# Patient Record
Sex: Female | Born: 1955 | Race: White | Hispanic: No | Smoking: Never smoker
Health system: Southern US, Community
[De-identification: ages and names within clinical notes are randomized; demographics above are authoritative.]

## PROBLEM LIST (undated history)

## (undated) DIAGNOSIS — E7800 Pure hypercholesterolemia, unspecified: Secondary | ICD-10-CM

## (undated) DIAGNOSIS — M5134 Other intervertebral disc degeneration, thoracic region: Secondary | ICD-10-CM

## (undated) HISTORY — PX: CATARACT EXTRACTION, BILATERAL: SHX1313

## (undated) HISTORY — DX: Pure hypercholesterolemia, unspecified: E78.00

## (undated) HISTORY — PX: DILATION AND CURETTAGE OF UTERUS: SHX78

## (undated) HISTORY — DX: Other intervertebral disc degeneration, thoracic region: M51.34

---

## 2005-02-26 ENCOUNTER — Emergency Department (HOSPITAL_COMMUNITY): Admission: EM | Admit: 2005-02-26 | Discharge: 2005-02-26 | Payer: Self-pay | Admitting: Emergency Medicine

## 2018-03-31 ENCOUNTER — Other Ambulatory Visit: Payer: Self-pay | Admitting: Obstetrics and Gynecology

## 2018-03-31 ENCOUNTER — Other Ambulatory Visit: Payer: Self-pay

## 2018-03-31 ENCOUNTER — Ambulatory Visit (INDEPENDENT_AMBULATORY_CARE_PROVIDER_SITE_OTHER): Payer: No Typology Code available for payment source | Admitting: Obstetrics and Gynecology

## 2018-03-31 ENCOUNTER — Encounter: Payer: Self-pay | Admitting: Obstetrics and Gynecology

## 2018-03-31 ENCOUNTER — Other Ambulatory Visit (HOSPITAL_COMMUNITY)
Admission: RE | Admit: 2018-03-31 | Discharge: 2018-03-31 | Disposition: A | Discharge status: Home / Self Care | Payer: No Typology Code available for payment source | Source: Ambulatory Visit | Attending: Obstetrics and Gynecology | Admitting: Obstetrics and Gynecology

## 2018-03-31 VITALS — BP 126/70 | HR 60 | Resp 16 | Ht 63.5 in | Wt 173.4 lb

## 2018-03-31 DIAGNOSIS — Z01419 Encounter for gynecological examination (general) (routine) without abnormal findings: Secondary | ICD-10-CM

## 2018-03-31 DIAGNOSIS — Z1231 Encounter for screening mammogram for malignant neoplasm of breast: Secondary | ICD-10-CM

## 2018-03-31 NOTE — Progress Notes (Signed)
63 y.o. G11P1010 Widowed Caucasian female here for annual exam.    Husband passed in 2018 and she moved back home to be closer to family.  He had a heart valve and had organ failure following surgery. Son is 64 yo and living at home with her following her divorce.   Using Vagifem.  She has refills for one year.  Not sexually active.   Taking calclium 500 mg two at hs.  Takes daily vit D 2000 IU daily.   Labs with PCP in March.   PCP: Tracey Harries, MD    Patient's last menstrual period was 03/05/2008 (approximate).           Sexually active: No.  The current method of family planning is post menopausal status.    Exercising: Yes.    walks a lot of stairs at home Smoker:  no  Health Maintenance: Pap:  03/2017 normal per patient History of abnormal Pap:  no MMG:  05/2017 normal per patient--in [facility] Colonoscopy:  2015 polyps;next due 2020 (was done in [place]).  States she is due in 10 years.  BMD:   2017  Result  Normal per patient --in [place] TDaP:  Over 10 years--does not want Gardasil:   no ZOX:WRUEA.  Did a recent insurance exam.  Hep C:never.  Did a recent insurance exam. Screening Labs:  PCP.  Flu vaccine:  Done.    reports that she has never smoked. She has never used smokeless tobacco. She reports current alcohol use. She reports that she does not use drugs.  Past Medical History:  Diagnosis Date  . Elevated cholesterol     Past Surgical History:  Procedure Laterality Date  . CATARACT EXTRACTION, BILATERAL    . CESAREAN SECTION  1983   [place]  . DILATION AND CURETTAGE OF UTERUS      Current Outpatient Medications  Medication Sig Dispense Refill  . albuterol (PROVENTIL HFA;VENTOLIN HFA) 108 (90 Base) MCG/ACT inhaler Inhale 1 puff into the lungs as needed.    . B Complex-C (B-COMPLEX WITH VITAMIN C) tablet Take 1 tablet by mouth daily.    . cetirizine (ZYRTEC) 10 MG tablet Take 10 mg by mouth daily.    . Cholecalciferol (VITAMIN D) 50 MCG (2000 UT) CAPS Take 1 tablet  by mouth daily.    . Coenzyme Q10 (COQ-10) 100 MG CAPS Take 1 tablet by mouth daily.    Marland Kitchen Lysine 500 MG TABS Take 1 tablet by mouth daily.    . magnesium gluconate (MAGONATE) 500 MG tablet Take 1 tablet by mouth daily.    . pravastatin (PRAVACHOL) 20 MG tablet Take 20 mg by mouth daily.    . Probiotic Product (ADVANCED PROBIOTIC-14) CAPS Take 1 capsule by mouth daily.    . Red Yeast Rice 600 MG TABS Take 1 tablet by mouth daily.    Anson Fret 10 MCG TABS vaginal tablet Place 1 tablet vaginally 2 (two) times a week.     No current facility-administered medications for this visit.     Family History  Problem Relation Age of Onset  . Hyperlipidemia Mother   . Hyperlipidemia Father   . Breast cancer Maternal Aunt   . Diabetes Maternal Grandfather   . Hypertension Sister     Review of Systems  All other systems reviewed and are negative.   Exam:   BP 126/70 (BP Location: Right Arm, Patient Position: Sitting, Cuff Size: Large)   Pulse 60   Resp 16   Ht 5' 3.5" (  1.613 m)   Wt 173 lb 6.4 oz (78.7 kg)   LMP 03/05/2008 (Approximate)   BMI 30.23 kg/m     General appearance: alert, cooperative and appears stated age Head: Normocephalic, without obvious abnormality, atraumatic Neck: no adenopathy, supple, symmetrical, trachea midline and thyroid normal to inspection and palpation Lungs: clear to auscultation bilaterally Breasts: normal appearance, no masses or tenderness, No nipple retraction or dimpling, No nipple discharge or bleeding, No axillary or supraclavicular adenopathy Heart: regular rate and rhythm Abdomen: soft, non-tender; no masses, no organomegaly Extremities: extremities normal, atraumatic, no cyanosis or edema Skin: Skin color, texture, turgor normal. No rashes or lesions Lymph nodes: Cervical, supraclavicular, and axillary nodes normal. No abnormal inguinal nodes palpated Neurologic: Grossly normal  Pelvic: External genitalia:  no lesions              Urethra:   normal appearing urethra with no masses, tenderness or lesions              Bartholins and Skenes: normal                 Vagina: normal appearing vagina with normal color and discharge, no lesions              Cervix: no lesions              Pap taken: Yes.   Bimanual Exam:  Uterus:  normal size, contour, position, consistency, mobility, non-tender              Adnexa: no mass, fullness, tenderness              Rectal exam: Yes.  .  Confirms.              Anus:  normal sphincter tone, no lesions  Chaperone was present for exam.  Assessment:   Well woman visit with normal exam. Hyperlipidemia.  Vaginal atrophy.  On Vagifem.  Does not need refills at this time.  Plan: Mammogram screening. Facility list given to patient.  She will schedule.  Recommended self breast awareness. Pap and HR HPV as above. Guidelines for Calcium, Vitamin D, regular exercise program including cardiovascular and weight bearing exercise. Labs with PCP.  Take calcium bid.   Follow up annually and prn.    After visit summary provided.

## 2018-03-31 NOTE — Patient Instructions (Signed)

## 2018-04-01 LAB — CYTOLOGY - PAP
Diagnosis: NEGATIVE
HPV: NOT DETECTED

## 2018-04-30 ENCOUNTER — Ambulatory Visit
Admission: RE | Admit: 2018-04-30 | Discharge: 2018-04-30 | Disposition: A | Discharge status: Home / Self Care | Payer: PRIVATE HEALTH INSURANCE | Source: Ambulatory Visit | Attending: Obstetrics and Gynecology | Admitting: Obstetrics and Gynecology

## 2018-04-30 DIAGNOSIS — Z1231 Encounter for screening mammogram for malignant neoplasm of breast: Secondary | ICD-10-CM

## 2019-02-24 ENCOUNTER — Other Ambulatory Visit: Payer: Self-pay | Admitting: Obstetrics and Gynecology

## 2019-02-24 DIAGNOSIS — Z01419 Encounter for gynecological examination (general) (routine) without abnormal findings: Secondary | ICD-10-CM

## 2019-02-24 MED ORDER — YUVAFEM 10 MCG VA TABS: 1.0000 | ORAL_TABLET | VAGINAL | 2 refills | Status: DC

## 2019-02-24 NOTE — Telephone Encounter (Signed)
Spoke to pt. Pt made aware of refill request had been filled. Pt agreeable.   Routing to provider for final review. Patient is agreeable to disposition. Will close encounter.

## 2019-02-24 NOTE — Telephone Encounter (Signed)
Med refill request: Yuvafem Last AEX:03/31/2018 Next AEX: 04/08/2019 Last MMG (if hormonal med) 04/30/2018 BIRADS 1, Negative Refill authorized: #14 tabs, 0 RF to get to AEX, orders pended if approved.

## 2019-02-24 NOTE — Telephone Encounter (Signed)
Patient requesting refill on Yuvafem. [place] [phone].

## 2019-03-17 ENCOUNTER — Other Ambulatory Visit: Payer: Self-pay | Admitting: Obstetrics and Gynecology

## 2019-03-17 DIAGNOSIS — Z1231 Encounter for screening mammogram for malignant neoplasm of breast: Secondary | ICD-10-CM

## 2019-04-07 NOTE — Progress Notes (Signed)
64 y.o. G62P1010 Widowed Caucasian female here for annual exam.    She is vaginal estrogen treatment.   Tested negative for Covid on 04/01/19 due to an indirect exposure.   She has battled an upper respiratory infection this winter.  PCP caring for her.  She is taking over the counter medication.  She did test negative for Covid with this evaluation also.   PCP:  Bernerd Limbo, MD   Patient's last menstrual period was 03/05/2008 (approximate).           Sexually active: No.  The current method of family planning is post menopausal status.    Exercising: Yes.    going up steps and walking Smoker:  no  Health Maintenance: Pap: 03-31-18 Neg:Neg HR HPV,  03/2017 normal per patient History of abnormal Pap:  no MMG: 04-30-18 3D/Neg/density C/BiRads1.  Already scheduled for March, 2021.  Colonoscopy: 2018 polyps;(was done in [place]).  States she is due in 2023.  BMD:   2017  Result :normal per patient in [place]: PCP Gardasil:   no HIV: a few yrs ago for Ins. Hep C: a few yrs ago for Ins. Screening Labs:   PCP. Flu vaccine:  Completed.    reports that she has never smoked. She has never used smokeless tobacco. She reports previous alcohol use. She reports that she does not use drugs.  Past Medical History:  Diagnosis Date  . Elevated cholesterol     Past Surgical History:  Procedure Laterality Date  . CATARACT EXTRACTION, BILATERAL    . [place]  . DILATION AND CURETTAGE OF UTERUS      Current Outpatient Medications  Medication Sig Dispense Refill  . albuterol (PROVENTIL HFA;VENTOLIN HFA) 108 (90 Base) MCG/ACT inhaler Inhale 1 puff into the lungs as needed.    . B Complex-C (B-COMPLEX WITH VITAMIN C) tablet Take 1 tablet by mouth daily.    . calcium-vitamin D (OSCAL WITH D) 500-200 MG-UNIT TABS tablet Take 1 tablet by mouth daily.    . Cholecalciferol (VITAMIN D) 50 MCG (2000 UT) CAPS Take 1 tablet by mouth daily.    . Coenzyme Q10 (COQ-10) 100 MG CAPS Take  1 tablet by mouth daily.    . fexofenadine (ALLEGRA) 180 MG tablet Take 180 mg by mouth as needed for allergies or rhinitis.    Marland Kitchen Lysine 500 MG TABS Take 1 tablet by mouth daily.    . magnesium gluconate (MAGONATE) 500 MG tablet Take 1 tablet by mouth daily.    . pravastatin (PRAVACHOL) 20 MG tablet Take 20 mg by mouth daily.    . Probiotic Product (ADVANCED PROBIOTIC-14) CAPS Take 1 capsule by mouth daily.    . Red Yeast Rice 600 MG TABS Take 1 tablet by mouth daily.    Merril Abbe 10 MCG TABS vaginal tablet Place 1 tablet (10 mcg total) vaginally 2 (two) times a week. 8 tablet 2   No current facility-administered medications for this visit.    Family History  Problem Relation Age of Onset  . Hyperlipidemia Mother   . Hyperlipidemia Father   . Breast cancer Maternal Aunt   . Diabetes Maternal Grandfather   . Hypertension Sister     Review of Systems  All other systems reviewed and are negative.   Exam:   BP 124/82 (Cuff Size: Large)   Pulse 64   Temp (!) 97.3 F (36.3 C) (Temporal)   Resp 18   Ht 5' 3.25" (1.607 m)  Wt 177 lb (80.3 kg)   LMP 03/05/2008 (Approximate)   BMI 31.11 kg/m     General appearance: alert, cooperative and appears stated age Head: normocephalic, without obvious abnormality, atraumatic Neck: no adenopathy, supple, symmetrical, trachea midline and thyroid normal to inspection and palpation Lungs: clear to auscultation bilaterally Breasts: normal appearance, no masses or tenderness, No nipple retraction or dimpling, No nipple discharge or bleeding, No axillary adenopathy Heart: regular rate and rhythm Abdomen: soft, non-tender; no masses, no organomegaly Extremities: extremities normal, atraumatic, no cyanosis or edema Skin: skin color, texture, turgor normal. No rashes or lesions Lymph nodes: cervical, supraclavicular, and axillary nodes normal. Neurologic: grossly normal  Pelvic: External genitalia:  no lesions              No abnormal inguinal  nodes palpated.              Urethra:  normal appearing urethra with no masses, tenderness or lesions              Bartholins and Skenes: normal                 Vagina: normal appearing vagina with normal color and discharge, no lesions              Cervix: no lesions              Pap taken: No. Bimanual Exam:  Uterus:  normal size, contour, position, consistency, mobility, non-tender              Adnexa: no mass, fullness, tenderness              Rectal exam: Yes.  .  Confirms.              Anus:  normal sphincter tone, no lesions  Chaperone was present for exam.  Assessment:   Well woman visit with normal exam. Vaginal atrophy.  On Vagifem.   Plan: Mammogram screening discussed. Self breast awareness reviewed. Pap and HR HPV as above. Guidelines for Calcium, Vitamin D, regular exercise program including cardiovascular and weight bearing exercise. Refill of Vagifem.   I discussed potential effect on breast cancer.  Labs with PCP. Follow up annually and prn.   After visit summary provided.

## 2019-04-08 ENCOUNTER — Encounter: Payer: Self-pay | Admitting: Obstetrics and Gynecology

## 2019-04-08 ENCOUNTER — Ambulatory Visit (INDEPENDENT_AMBULATORY_CARE_PROVIDER_SITE_OTHER): Payer: No Typology Code available for payment source | Admitting: Obstetrics and Gynecology

## 2019-04-08 ENCOUNTER — Other Ambulatory Visit: Payer: Self-pay

## 2019-04-08 DIAGNOSIS — Z01419 Encounter for gynecological examination (general) (routine) without abnormal findings: Secondary | ICD-10-CM

## 2019-04-08 MED ORDER — YUVAFEM 10 MCG VA TABS: 1.0000 | ORAL_TABLET | VAGINAL | 3 refills | Status: DC

## 2019-04-08 NOTE — Patient Instructions (Signed)

## 2019-05-04 ENCOUNTER — Other Ambulatory Visit: Payer: Self-pay

## 2019-05-04 ENCOUNTER — Ambulatory Visit
Admission: RE | Admit: 2019-05-04 | Discharge: 2019-05-04 | Disposition: A | Discharge status: Home / Self Care | Payer: PRIVATE HEALTH INSURANCE | Source: Ambulatory Visit | Attending: Obstetrics and Gynecology | Admitting: Obstetrics and Gynecology

## 2019-05-04 DIAGNOSIS — Z1231 Encounter for screening mammogram for malignant neoplasm of breast: Secondary | ICD-10-CM

## 2019-05-06 ENCOUNTER — Other Ambulatory Visit: Payer: Self-pay | Admitting: Obstetrics and Gynecology

## 2019-05-06 DIAGNOSIS — R928 Other abnormal and inconclusive findings on diagnostic imaging of breast: Secondary | ICD-10-CM

## 2019-05-20 ENCOUNTER — Ambulatory Visit
Admission: RE | Admit: 2019-05-20 | Discharge: 2019-05-20 | Disposition: A | Discharge status: Home / Self Care | Payer: PRIVATE HEALTH INSURANCE | Source: Ambulatory Visit | Attending: Obstetrics and Gynecology | Admitting: Obstetrics and Gynecology

## 2019-05-20 ENCOUNTER — Other Ambulatory Visit: Payer: Self-pay

## 2019-05-20 ENCOUNTER — Ambulatory Visit: Payer: PRIVATE HEALTH INSURANCE

## 2019-05-20 DIAGNOSIS — R928 Other abnormal and inconclusive findings on diagnostic imaging of breast: Secondary | ICD-10-CM

## 2019-07-02 ENCOUNTER — Other Ambulatory Visit: Payer: Self-pay

## 2019-07-02 ENCOUNTER — Emergency Department (HOSPITAL_BASED_OUTPATIENT_CLINIC_OR_DEPARTMENT_OTHER): Payer: PRIVATE HEALTH INSURANCE

## 2019-07-02 ENCOUNTER — Observation Stay (HOSPITAL_BASED_OUTPATIENT_CLINIC_OR_DEPARTMENT_OTHER)
Admission: EM | Admit: 2019-07-02 | Discharge: 2019-07-04 | Disposition: A | Discharge status: Home / Self Care | Payer: PRIVATE HEALTH INSURANCE | Attending: Physician Assistant | Admitting: Physician Assistant

## 2019-07-02 ENCOUNTER — Encounter (HOSPITAL_BASED_OUTPATIENT_CLINIC_OR_DEPARTMENT_OTHER): Payer: Self-pay | Admitting: *Deleted

## 2019-07-02 DIAGNOSIS — Z79899 Other long term (current) drug therapy: Secondary | ICD-10-CM | POA: Insufficient documentation

## 2019-07-02 DIAGNOSIS — E7800 Pure hypercholesterolemia, unspecified: Secondary | ICD-10-CM | POA: Diagnosis not present

## 2019-07-02 DIAGNOSIS — Z20822 Contact with and (suspected) exposure to covid-19: Secondary | ICD-10-CM | POA: Insufficient documentation

## 2019-07-02 DIAGNOSIS — S2241XA Multiple fractures of ribs, right side, initial encounter for closed fracture: Principal | ICD-10-CM | POA: Insufficient documentation

## 2019-07-02 DIAGNOSIS — S2239XA Fracture of one rib, unspecified side, initial encounter for closed fracture: Secondary | ICD-10-CM | POA: Diagnosis present

## 2019-07-02 DIAGNOSIS — I700 Atherosclerosis of aorta: Secondary | ICD-10-CM | POA: Insufficient documentation

## 2019-07-02 DIAGNOSIS — Z881 Allergy status to other antibiotic agents status: Secondary | ICD-10-CM | POA: Diagnosis not present

## 2019-07-02 DIAGNOSIS — S2249XA Multiple fractures of ribs, unspecified side, initial encounter for closed fracture: Secondary | ICD-10-CM | POA: Diagnosis present

## 2019-07-02 DIAGNOSIS — W11XXXA Fall on and from ladder, initial encounter: Secondary | ICD-10-CM | POA: Diagnosis not present

## 2019-07-02 DIAGNOSIS — I2510 Atherosclerotic heart disease of native coronary artery without angina pectoris: Secondary | ICD-10-CM | POA: Diagnosis not present

## 2019-07-02 DIAGNOSIS — J9811 Atelectasis: Secondary | ICD-10-CM | POA: Diagnosis not present

## 2019-07-02 DIAGNOSIS — S270XXA Traumatic pneumothorax, initial encounter: Secondary | ICD-10-CM | POA: Diagnosis not present

## 2019-07-02 DIAGNOSIS — J939 Pneumothorax, unspecified: Secondary | ICD-10-CM | POA: Diagnosis present

## 2019-07-02 LAB — COMPREHENSIVE METABOLIC PANEL
ALT: 19 U/L (ref 0–44)
AST: 26 U/L (ref 15–41)
Albumin: 4.4 g/dL (ref 3.5–5.0)
Alkaline Phosphatase: 65 U/L (ref 38–126)
Anion gap: 11 (ref 5–15)
BUN: 16 mg/dL (ref 8–23)
CO2: 25 mmol/L (ref 22–32)
Calcium: 9.6 mg/dL (ref 8.9–10.3)
Chloride: 104 mmol/L (ref 98–111)
Creatinine, Ser: 0.92 mg/dL (ref 0.44–1.00)
GFR calc Af Amer: >60 mL/min (ref >60)
GFR calc non Af Amer: >60 mL/min (ref >60)
Glucose, Bld: 109 mg/dL — ABNORMAL HIGH (ref 70–99)
Potassium: 4.4 mmol/L (ref 3.5–5.1)
Sodium: 140 mmol/L (ref 135–145)
Total Bilirubin: 0.4 mg/dL (ref 0.3–1.2)
Total Protein: 7.5 g/dL (ref 6.5–8.1)

## 2019-07-02 LAB — CBC
HCT: 38.5 % (ref 36.0–46.0)
HCT: 35.5 % — ABNORMAL LOW (ref 36.0–46.0)
Hemoglobin: 12.4 g/dL (ref 12.0–15.0)
Hemoglobin: 11.1 g/dL — ABNORMAL LOW (ref 12.0–15.0)
MCH: 30.3 pg (ref 26.0–34.0)
MCH: 29.7 pg (ref 26.0–34.0)
MCHC: 32.2 g/dL (ref 30.0–36.0)
MCHC: 31.3 g/dL (ref 30.0–36.0)
MCV: 94.1 fL (ref 80.0–100.0)
MCV: 94.9 fL (ref 80.0–100.0)
Platelets: 265 10*3/uL (ref 150–400)
Platelets: 240 10*3/uL (ref 150–400)
RBC: 4.09 MIL/uL (ref 3.87–5.11)
RBC: 3.74 MIL/uL — ABNORMAL LOW (ref 3.87–5.11)
RDW: 13.7 % (ref 11.5–15.5)
RDW: 13.8 % (ref 11.5–15.5)
WBC: 14.5 10*3/uL — ABNORMAL HIGH (ref 4.0–10.5)
WBC: 10.8 10*3/uL — ABNORMAL HIGH (ref 4.0–10.5)
nRBC: 0 % (ref 0.0–0.2)
nRBC: 0 % (ref 0.0–0.2)

## 2019-07-02 LAB — CREATININE, SERUM
Creatinine, Ser: 0.9 mg/dL (ref 0.44–1.00)
GFR calc Af Amer: >60 mL/min (ref >60)
GFR calc non Af Amer: >60 mL/min (ref >60)

## 2019-07-02 LAB — SARS CORONAVIRUS 2 AG (30 MIN TAT): SARS Coronavirus 2 Ag: NEGATIVE

## 2019-07-02 LAB — PROTIME-INR
INR: 1 (ref 0.8–1.2)
Prothrombin Time: 12.8 s (ref 11.4–15.2)

## 2019-07-02 LAB — LACTIC ACID, PLASMA: Lactic Acid, Venous: 1.2 mmol/L (ref 0.5–1.9)

## 2019-07-02 LAB — ETHANOL: Alcohol, Ethyl (B): <10 mg/dL

## 2019-07-02 MED ORDER — OXYCODONE HCL 5 MG PO TABS
5.0000 mg | ORAL_TABLET | ORAL | Status: DC | PRN
  Administered 2019-07-03 – 2019-07-04 (×4): 5 mg via ORAL
  Filled 2019-07-02 (×4): qty 1

## 2019-07-02 MED ORDER — ONDANSETRON HCL 4 MG/2ML IJ SOLN
4.0000 mg | Freq: Once | INTRAMUSCULAR | Status: AC
  Administered 2019-07-02: 4 mg via INTRAVENOUS

## 2019-07-02 MED ORDER — HYDROMORPHONE HCL 1 MG/ML IJ SOLN
1.0000 mg | INTRAMUSCULAR | Status: DC | PRN
  Administered 2019-07-02: 1 mg via INTRAVENOUS
  Filled 2019-07-02: qty 1

## 2019-07-02 MED ORDER — SODIUM CHLORIDE 0.9 % IV SOLN: 250.0000 mL | INTRAVENOUS | Status: DC | PRN

## 2019-07-02 MED ORDER — ACETAMINOPHEN 500 MG PO TABS
1000.0000 mg | ORAL_TABLET | Freq: Four times a day (QID) | ORAL | Status: AC
  Administered 2019-07-02 – 2019-07-03 (×3): 1000 mg via ORAL
  Filled 2019-07-02 (×3): qty 2

## 2019-07-02 MED ORDER — SODIUM CHLORIDE 0.9% FLUSH: 3.0000 mL | INTRAVENOUS | Status: DC | PRN

## 2019-07-02 MED ORDER — ENOXAPARIN SODIUM 30 MG/0.3ML ~~LOC~~ SOLN
30.0000 mg | Freq: Two times a day (BID) | SUBCUTANEOUS | Status: DC
  Administered 2019-07-03 – 2019-07-04 (×3): 30 mg via SUBCUTANEOUS
  Filled 2019-07-02 (×3): qty 0.3

## 2019-07-02 MED ORDER — MORPHINE SULFATE (PF) 4 MG/ML IV SOLN: 4.0000 mg | INTRAVENOUS | Status: DC | PRN

## 2019-07-02 MED ORDER — OXYCODONE HCL 5 MG PO TABS: 10.0000 mg | ORAL_TABLET | ORAL | Status: DC | PRN

## 2019-07-02 MED ORDER — ONDANSETRON HCL 4 MG/2ML IJ SOLN: 4.0000 mg | Freq: Four times a day (QID) | INTRAMUSCULAR | Status: DC | PRN

## 2019-07-02 MED ORDER — ONDANSETRON HCL 4 MG/2ML IJ SOLN
INTRAMUSCULAR | Status: AC
  Filled 2019-07-02: qty 2

## 2019-07-02 MED ORDER — METHOCARBAMOL 500 MG PO TABS
500.0000 mg | ORAL_TABLET | Freq: Three times a day (TID) | ORAL | Status: DC
  Administered 2019-07-02 – 2019-07-03 (×3): 500 mg via ORAL
  Filled 2019-07-02 (×3): qty 1

## 2019-07-02 MED ORDER — SODIUM CHLORIDE 0.9% FLUSH
3.0000 mL | Freq: Two times a day (BID) | INTRAVENOUS | Status: DC
  Administered 2019-07-02 – 2019-07-04 (×4): 3 mL via INTRAVENOUS

## 2019-07-02 MED ORDER — OXYCODONE-ACETAMINOPHEN 5-325 MG PO TABS
1.0000 | ORAL_TABLET | Freq: Once | ORAL | Status: AC
  Administered 2019-07-02: 1 via ORAL
  Filled 2019-07-02: qty 1

## 2019-07-02 MED ORDER — ONDANSETRON 4 MG PO TBDP: 4.0000 mg | ORAL_TABLET | Freq: Four times a day (QID) | ORAL | Status: DC | PRN

## 2019-07-02 MED ORDER — HYDROMORPHONE HCL 1 MG/ML IJ SOLN
1.0000 mg | Freq: Once | INTRAMUSCULAR | Status: AC
  Administered 2019-07-02: 1 mg via INTRAVENOUS
  Filled 2019-07-02: qty 1

## 2019-07-02 NOTE — ED Notes (Signed)
Attempted to call report to floor, sec took name/# for RN to call back

## 2019-07-02 NOTE — ED Triage Notes (Signed)
She fell off a step stool and hit her right ribs on her wooden bed rail. She feels like she has a muscular injury.

## 2019-07-02 NOTE — ED Provider Notes (Signed)
[facility] EMERGENCY DEPARTMENT Provider Note   CSN: [id number] Arrival date & time: 07/02/19  1319     History Chief Complaint  Patient presents with  . Fall    Angel Poole is a 64 y.o. female with no significant past medical history presenting to ED with mechanical fall.  The patient reports she lost her footing stepping off of a step stool today and fell and struck her right ribs on the wooden rail of her bed.  She had immediate pain at that point.  She comes in with pain in the lateral side of her right ribs.  The pain is pleuritic and worse with inspiration.  She does not feel short of breath or lightheaded.  She denies blood thinner use.  There was no head trauma or LOC  HPI     Past Medical History:  Diagnosis Date  . Elevated cholesterol     There are no problems to display for this patient.   Past Surgical History:  Procedure Laterality Date  . CATARACT EXTRACTION, BILATERAL    . [place]  . DILATION AND CURETTAGE OF UTERUS       OB History    Gravida  2   Para  1   Term  1   Preterm      AB  1   Living        SAB      TAB      Ectopic      Multiple      Live Births              Family History  Problem Relation Age of Onset  . Hyperlipidemia Mother   . Hyperlipidemia Father   . Breast cancer Maternal Aunt   . Diabetes Maternal Grandfather   . Hypertension Sister     Social History   Tobacco Use  . Smoking status: Never Smoker  . Smokeless tobacco: Never Used  Substance Use Topics  . Alcohol use: Not Currently  . Drug use: Never    Home Medications Prior to Admission medications   Medication Sig Start Date End Date Taking? Authorizing Provider  albuterol (PROVENTIL HFA;VENTOLIN HFA) 108 (90 Base) MCG/ACT inhaler Inhale 1 puff into the lungs as needed. 12/06/17   [provider]  B Complex-C (B-COMPLEX WITH VITAMIN C) tablet Take 1 tablet by mouth daily.    [provider]  calcium-vitamin D (OSCAL WITH D) 500-200 MG-UNIT TABS tablet Take 1 tablet by mouth daily.    [provider]  Cholecalciferol (VITAMIN D) 50 MCG (2000 UT) CAPS Take 1 tablet by mouth daily.    [provider]  Coenzyme Q10 (COQ-10) 100 MG CAPS Take 1 tablet by mouth daily.    [provider]  fexofenadine (ALLEGRA) 180 MG tablet Take 180 mg by mouth as needed for allergies or rhinitis.    [provider]  Lysine 500 MG TABS Take 1 tablet by mouth daily.    [provider]  magnesium gluconate (MAGONATE) 500 MG tablet Take 1 tablet by mouth daily.    [provider]  pravastatin (PRAVACHOL) 20 MG tablet Take 20 mg by mouth daily. 02/22/18   [provider]  Probiotic Product (ADVANCED PROBIOTIC-14) CAPS Take 1 capsule by mouth daily.    [provider]  Red Yeast Rice 600 MG TABS Take 1 tablet by mouth daily.    [provider]  Merril Abbe 10  MCG TABS vaginal tablet Place 1 tablet (10 mcg total) vaginally 2 (two) times a week. 04/09/19   Patton Salles, MD    Allergies    Tetracyclines & related  Review of Systems   Review of Systems  Constitutional: Negative for chills and fever.  Eyes: Negative for pain and visual disturbance.  Respiratory: Negative for cough and shortness of breath.   Cardiovascular: Positive for chest pain. Negative for palpitations.  Gastrointestinal: Negative for abdominal pain and vomiting.  Musculoskeletal: Positive for arthralgias and myalgias.  Skin: Positive for wound. Negative for rash.  Neurological: Negative for syncope and headaches.  Psychiatric/Behavioral: Negative for agitation and confusion.  All other systems reviewed and are negative.   Physical Exam Updated Vital Signs BP 110/61 (BP Location: Left Arm)   Pulse 68   Temp 98.3 F (36.8 C) (Oral)   Resp 20   Ht 5\' 4"  (1.626 m)   Wt 80.3 kg   LMP 03/05/2008 (Approximate)   SpO2 97%   BMI  30.39 kg/m   Physical Exam Vitals and nursing note reviewed.  Constitutional:      General: She is not in acute distress.    Appearance: She is well-developed.  HENT:     Head: Normocephalic and atraumatic.  Eyes:     Conjunctiva/sclera: Conjunctivae normal.     Pupils: Pupils are equal, round, and reactive to light.  Cardiovascular:     Rate and Rhythm: Normal rate and regular rhythm.     Pulses: Normal pulses.  Pulmonary:     Effort: Pulmonary effort is normal. No respiratory distress.     Breath sounds: Normal breath sounds.  Abdominal:     General: There is no distension.     Palpations: Abdomen is soft.     Tenderness: There is no abdominal tenderness.  Musculoskeletal:     Cervical back: Neck supple.     Comments: Tenderness to right lateral ribline approx ribs 6-8th Linear superficial abrasion of the skin  Skin:    General: Skin is warm and dry.  Neurological:     General: No focal deficit present.     Mental Status: She is alert and oriented to person, place, and time.  Psychiatric:        Mood and Affect: Mood normal.        Behavior: Behavior normal.     ED Results / Procedures / Treatments   Labs (all labs ordered are listed, but only abnormal results are displayed) Labs Reviewed  COMPREHENSIVE METABOLIC PANEL - Abnormal; Notable for the following components:      Result Value   Glucose, Bld 109 (*)    All other components within normal limits  CBC - Abnormal; Notable for the following components:   WBC 14.5 (*)    All other components within normal limits  LACTIC ACID, PLASMA  PROTIME-INR  ETHANOL  URINALYSIS, ROUTINE W REFLEX MICROSCOPIC    EKG None  Radiology DG Ribs Unilateral W/Chest Right  Result Date: 07/02/2019 CLINICAL DATA:  Right-sided rib pain after fall EXAM: RIGHT RIBS AND CHEST - 3+ VIEW COMPARISON:  None. FINDINGS: Acute minimally displaced fractures of the posterolateral aspect of the right seventh, eighth, and ninth ribs.  Possible small right apical pneumothorax. Heart size is normal. No shift of the mediastinal structures to suggest tension component. Left lung is clear. IMPRESSION: 1. Acute minimally displaced fractures of the posterolateral aspect of the right seventh, eighth, and ninth ribs. 2. Possible small right apical  pneumothorax. These results were called by telephone at the time of interpretation on 07/02/2019 at 3:21 pm to provider Vada Yellen , who verbally acknowledged these results. Electronically Signed   By: Duanne Guess D.O.   On: 07/02/2019 15:21   CT Chest Wo Contrast  Result Date: 07/02/2019 CLINICAL DATA:  Fall off step stool striking right ribs on wooden bed rail. Rib fracture and possible pneumothorax on radiograph. EXAM: CT CHEST WITHOUT CONTRAST TECHNIQUE: Multidetector CT imaging of the chest was performed following the standard protocol without IV contrast. COMPARISON:  Rib radiographs earlier this day. FINDINGS: Cardiovascular: Mild aortic atherosclerosis. No periaortic stranding to suggest injury. Heart is normal in size. There are coronary artery calcifications. No pericardial effusion. Mediastinum/Nodes: No confluent mediastinal contusion. No pneumomediastinum. Decompressed esophagus. No mediastinal adenopathy. No suspicious thyroid nodule. Lungs/Pleura: Small right pneumothorax present anterior inferiorly is well as superior medially. This is less than 10%. Patchy opacities in the right lower lobe likely combination of atelectasis and contusion. There is dependent atelectasis in the lingula and left lower lobe. No left pneumothorax. No significant pleural effusion. Trachea and mainstem bronchi are patent. Upper Abdomen: No free fluid. No evidence of acute injury allowing for lack of IV contrast. Musculoskeletal: Right lateral sixth through ninth rib fractures. The 6, seventh, and eighth rib fractures are minimally displaced. No additional or segmental rib fracture. The left ribs, sternum,  included clavicles and shoulder girdles are intact. Thoracic spine is intact without acute fracture. Mild subcutaneous soft tissue edema at the rib fracture site. IMPRESSION: 1. Right lateral sixth through ninth rib fractures. The sixth, seventh, and eighth rib fractures are minimally displaced. 2. Small right pneumothorax, most prominent anterior inferiorly. Patchy opacities in the right lower lobe likely combination of atelectasis and contusion. 3. Coronary artery calcifications. Aortic Atherosclerosis (ICD10-I70.0). Electronically Signed   By: Narda Rutherford M.D.   On: 07/02/2019 16:14    Procedures Procedures (including critical care time)  Medications Ordered in ED Medications  oxyCODONE-acetaminophen (PERCOCET/ROXICET) 5-325 MG per tablet 1 tablet (1 tablet Oral Given 07/02/19 1513)  HYDROmorphone (DILAUDID) injection 1 mg (1 mg Intravenous Given 07/02/19 1704)  ondansetron (ZOFRAN) injection 4 mg (4 mg Intravenous Given 07/02/19 1709)    ED Course  I have reviewed the triage vital signs and the nursing notes.  Pertinent labs & imaging results that were available during my care of the patient were reviewed by me and considered in my medical decision making (see chart for details).  64 year old female presented to ED with a mechanical fall off a stepstool from no significant height, striking her right ribs on a wooden bed frame.  There was no head trauma or loss of consciousness.  She does have tenderness palpation along her rib line today.  She is not hypoxic and has equal breath sounds bilaterally.  Rib x-rays did demonstrate nondisplaced fractures of the seventh eighth and ninth rib on the right side.  There is a questionable pneumothorax seen at the right apex.  We'll get a CT scan to better characterize this possible PTX.  I spoke with the trauma PA at [facility] who also spoke to Dr Janee Morn the trauma MD.  They requested to be contacted again when the CT results were back.     We gave  percocet for pain control here The patient would have a preference for management at home if possible.  She lives with her son.   On reassessment, patient reported some improvement of her pain with percocet but it  continues to be significant.  She and her family are quite concerned about the rib fractures and the PTX.  Her care was signed out to Dr Dalene Seltzer the evening EDP, who is pending a call back to Dr Janee Morn the trauma surgeon.  Anticipate observation admission for the PTX and pain control, likely just 24 hours.  Patient and family are agreeable to this and understand she would need transfer to [facility] hospital for this.  Final Clinical Impression(s) / ED Diagnoses Final diagnoses:  Traumatic pneumothorax, initial encounter  Closed fracture of multiple ribs of right side, initial encounter    Rx / DC Orders ED Discharge Orders    None       Camara Renstrom, Kermit Balo, MD 07/02/19 1733

## 2019-07-02 NOTE — H&P (Signed)
Angel Poole is an 64 y.o. female.   Chief Complaint: R rib pain HPI: 64 year old female was up on a stepladder changing the filter in her air purifier.  As she was stepping down, she lost her balance and fell, striking her right side on the wooden bed frame.  No loss of consciousness.  She was evaluated at [facility].  She was found to have right rib fractures 6-9 and a small pneumothorax.  She was accepted in transfer for observation and pain control.  Past Medical History:  Diagnosis Date  . Elevated cholesterol     Past Surgical History:  Procedure Laterality Date  . CATARACT EXTRACTION, BILATERAL    . [place]  . DILATION AND CURETTAGE OF UTERUS      Family History  Problem Relation Age of Onset  . Hyperlipidemia Mother   . Hyperlipidemia Father   . Breast cancer Maternal Aunt   . Diabetes Maternal Grandfather   . Hypertension Sister    Social History:  reports that she has never smoked. She has never used smokeless tobacco. She reports previous alcohol use. She reports that she does not use drugs.  Allergies:  Allergies  Allergen Reactions  . Tetracyclines & Related Rash    Medications Prior to Admission  Medication Sig Dispense Refill  . albuterol (PROVENTIL HFA;VENTOLIN HFA) 108 (90 Base) MCG/ACT inhaler Inhale 1 puff into the lungs as needed.    . B Complex-C (B-COMPLEX WITH VITAMIN C) tablet Take 1 tablet by mouth daily.    . calcium-vitamin D (OSCAL WITH D) 500-200 MG-UNIT TABS tablet Take 1 tablet by mouth daily.    . Cholecalciferol (VITAMIN D) 50 MCG (2000 UT) CAPS Take 1 tablet by mouth daily.    . Coenzyme Q10 (COQ-10) 100 MG CAPS Take 1 tablet by mouth daily.    . fexofenadine (ALLEGRA) 180 MG tablet Take 180 mg by mouth as needed for allergies or rhinitis.    Marland Kitchen Lysine 500 MG TABS Take 1 tablet by mouth daily.    . magnesium gluconate (MAGONATE) 500 MG tablet Take 1 tablet by mouth daily.    . pravastatin (PRAVACHOL)  20 MG tablet Take 20 mg by mouth daily.    . Probiotic Product (ADVANCED PROBIOTIC-14) CAPS Take 1 capsule by mouth daily.    . Red Yeast Rice 600 MG TABS Take 1 tablet by mouth daily.    Merril Abbe 10 MCG TABS vaginal tablet Place 1 tablet (10 mcg total) vaginally 2 (two) times a week. 24 tablet 3    Results for orders placed or performed during the hospital encounter of 07/02/19 (from the past 48 hour(s))  Comprehensive metabolic panel     Status: Abnormal   Collection Time: 07/02/19  5:01 PM  Result Value Ref Range   Sodium 140 135 - 145 mmol/L   Potassium 4.4 3.5 - 5.1 mmol/L   Chloride 104 98 - 111 mmol/L   CO2 25 22 - 32 mmol/L   Glucose, Bld 109 (H) 70 - 99 mg/dL    Comment: Glucose reference range applies only to samples taken after fasting for at least 8 hours.   BUN 16 8 - 23 mg/dL   Creatinine, Ser 0.92 0.44 - 1.00 mg/dL   Calcium 9.6 8.9 - 10.3 mg/dL   Total Protein 7.5 6.5 - 8.1 g/dL   Albumin 4.4 3.5 - 5.0 g/dL   AST 26 15 - 41 U/L   ALT 19  0 - 44 U/L   Alkaline Phosphatase 65 38 - 126 U/L   Total Bilirubin 0.4 0.3 - 1.2 mg/dL   GFR calc non Af Amer >60 >60 mL/min   GFR calc Af Amer >60 >60 mL/min   Anion gap 11 5 - 15    Comment: Performed at [facility], [address] [place], [place] [address]  CBC     Status: Abnormal   Collection Time: 07/02/19  5:01 PM  Result Value Ref Range   WBC 14.5 (H) 4.0 - 10.5 K/uL   RBC 4.09 3.87 - 5.11 MIL/uL   Hemoglobin 12.4 12.0 - 15.0 g/dL   HCT 38.5 36.0 - 46.0 %   MCV 94.1 80.0 - 100.0 fL   MCH 30.3 26.0 - 34.0 pg   MCHC 32.2 30.0 - 36.0 g/dL   RDW 13.7 11.5 - 15.5 %   Platelets 265 150 - 400 K/uL   nRBC 0.0 0.0 - 0.2 %    Comment: Performed at [facility], [address] [place], [place] [address]  Ethanol     Status: None   Collection Time: 07/02/19  5:01 PM  Result Value Ref Range   Alcohol, Ethyl (B) <10 <10 mg/dL    Comment:        LOWEST DETECTABLE LIMIT FOR SERUM ALCOHOL IS 10  mg/dL FOR MEDICAL PURPOSES ONLY Performed at [facility], [address] [place], [place] [address]   Lactic acid, plasma     Status: None   Collection Time: 07/02/19  5:01 PM  Result Value Ref Range   Lactic Acid, Venous 1.2 0.5 - 1.9 mmol/L    Comment: Performed at [facility], [address] [place], [place] [address]  Protime-INR     Status: None   Collection Time: 07/02/19  5:01 PM  Result Value Ref Range   Prothrombin Time 12.8 11.4 - 15.2 seconds   INR 1.0 0.8 - 1.2    Comment: (NOTE) INR goal varies based on device and disease states. Performed at [facility], [address] [place], [place] [address]   SARS Coronavirus 2 Ag (30 min TAT) - Nasal Swab (BD Veritor Kit)     Status: None   Collection Time: 07/02/19  7:50 PM   Specimen: Nasal Swab (BD Veritor Kit)  Result Value Ref Range   SARS Coronavirus 2 Ag NEGATIVE NEGATIVE    Comment: (NOTE) SARS-CoV-2 antigen NOT DETECTED.  Negative results are presumptive.  Negative results do not preclude SARS-CoV-2 infection and should not be used as the sole basis for treatment or other patient management decisions, including infection  control decisions, particularly in the presence of clinical signs and  symptoms consistent with COVID-19, or in those who have been in contact with the virus.  Negative results must be combined with clinical observations, patient history, and epidemiological information. The expected result is Negative. Fact Sheet for Patients: PodPark.tn Fact Sheet for Healthcare Providers: GiftContent.is This test is not yet approved or cleared by the [place] FDA and  has been authorized for detection and/or diagnosis of SARS-CoV-2 by FDA under an Emergency Use Authorization (EUA).  This EUA will remain in effect (meaning this test can be used) for the duration of  the COVID-19 de claration under Section  564(b)(1) of the Act, 21 U.S.C. section 360bbb-3(b)(1), unless the authorization is terminated or revoked sooner. Performed at [facility], [address] [place], [place] [address]    DG Ribs Unilateral W/Chest Right  Result Date: 07/02/2019 CLINICAL DATA:  Right-sided rib pain after fall EXAM: RIGHT RIBS AND CHEST - 3+ VIEW COMPARISON:  None. FINDINGS: Acute minimally displaced fractures of the posterolateral aspect of the right seventh, eighth, and ninth ribs. Possible small right apical pneumothorax. Heart size is normal. No shift of the mediastinal structures to suggest tension component. Left lung is clear. IMPRESSION: 1. Acute minimally displaced fractures of the posterolateral aspect of the right seventh, eighth, and ninth ribs. 2. Possible small right apical pneumothorax. These results were called by telephone at the time of interpretation on 07/02/2019 at 3:21 pm to provider MATTHEW TRIFAN , who verbally acknowledged these results. Electronically Signed   By: Davina Poke D.O.   On: 07/02/2019 15:21   CT Chest Wo Contrast  Result Date: 07/02/2019 CLINICAL DATA:  Fall off step stool striking right ribs on wooden bed rail. Rib fracture and possible pneumothorax on radiograph. EXAM: CT CHEST WITHOUT CONTRAST TECHNIQUE: Multidetector CT imaging of the chest was performed following the standard protocol without IV contrast. COMPARISON:  Rib radiographs earlier this day. FINDINGS: Cardiovascular: Mild aortic atherosclerosis. No periaortic stranding to suggest injury. Heart is normal in size. There are coronary artery calcifications. No pericardial effusion. Mediastinum/Nodes: No confluent mediastinal contusion. No pneumomediastinum. Decompressed esophagus. No mediastinal adenopathy. No suspicious thyroid nodule. Lungs/Pleura: Small right pneumothorax present anterior inferiorly is well as superior medially. This is less than 10%. Patchy opacities in the right lower lobe likely  combination of atelectasis and contusion. There is dependent atelectasis in the lingula and left lower lobe. No left pneumothorax. No significant pleural effusion. Trachea and mainstem bronchi are patent. Upper Abdomen: No free fluid. No evidence of acute injury allowing for lack of IV contrast. Musculoskeletal: Right lateral sixth through ninth rib fractures. The 6, seventh, and eighth rib fractures are minimally displaced. No additional or segmental rib fracture. The left ribs, sternum, included clavicles and shoulder girdles are intact. Thoracic spine is intact without acute fracture. Mild subcutaneous soft tissue edema at the rib fracture site. IMPRESSION: 1. Right lateral sixth through ninth rib fractures. The sixth, seventh, and eighth rib fractures are minimally displaced. 2. Small right pneumothorax, most prominent anterior inferiorly. Patchy opacities in the right lower lobe likely combination of atelectasis and contusion. 3. Coronary artery calcifications. Aortic Atherosclerosis (ICD10-I70.0). Electronically Signed   By: Keith Rake M.D.   On: 07/02/2019 16:14    Review of Systems  Constitutional: Negative for activity change.  HENT: Negative.   Eyes: Negative.   Respiratory: Negative for shortness of breath and wheezing.   Cardiovascular: Positive for chest pain.  Gastrointestinal: Negative for abdominal pain and nausea.  Endocrine: Negative.   Genitourinary: Negative.   Musculoskeletal: Positive for back pain.  Skin: Negative.   Allergic/Immunologic: Negative.   Neurological: Negative.   Hematological: Negative.   Psychiatric/Behavioral: Negative.     Blood pressure 131/72, pulse 78, temperature 98.5 F (36.9 C), temperature source Oral, resp. rate 18, height 5' 4"  (1.626 m), weight 80.3 kg, last menstrual period 03/05/2008, SpO2 92 %. Physical Exam  Constitutional: She is oriented to person, place, and time. She appears well-developed and well-nourished.  HENT:  Right Ear:  External ear normal.  Left Ear: External ear normal.  Nose: Nose normal.  Mouth/Throat: Oropharynx is clear and moist.  Eyes: Pupils are equal, round, and reactive to light. EOM are normal. Right eye exhibits no discharge. Left eye exhibits  no discharge. No scleral icterus.  Neck: No tracheal deviation present. No thyromegaly present.  Nontender  Cardiovascular: Normal rate, regular rhythm, normal heart sounds and intact distal pulses.  Respiratory: Effort normal and breath sounds normal. No respiratory distress. She has no wheezes. She has no rales. She exhibits tenderness.  Right-sided chest wall tenderness  GI: Soft. She exhibits no distension and no mass. There is no abdominal tenderness. There is no rebound and no guarding.  No hepatosplenomegaly  Musculoskeletal:        General: No edema. Normal range of motion.  Neurological: She is alert and oriented to person, place, and time. She displays no atrophy and no tremor. No cranial nerve deficit. She exhibits normal muscle tone. She displays no seizure activity. GCS eye subscore is 4. GCS verbal subscore is 5. GCS motor subscore is 6.  Skin: Skin is warm.  Psychiatric: She has a normal mood and affect.  A&O x3     Assessment/Plan Fall off stepladder Right rib fracture 6-9 with small pneumothorax  Admit for observation, multimodal pain control, pulmonary toilet.  Chest x-ray in a.m.  Zenovia Jarred, MD 07/02/2019, 9:26 PM

## 2019-07-03 ENCOUNTER — Observation Stay (HOSPITAL_COMMUNITY): Payer: PRIVATE HEALTH INSURANCE

## 2019-07-03 LAB — BASIC METABOLIC PANEL
Anion gap: 8 (ref 5–15)
BUN: 15 mg/dL (ref 8–23)
CO2: 25 mmol/L (ref 22–32)
Calcium: 8.9 mg/dL (ref 8.9–10.3)
Chloride: 105 mmol/L (ref 98–111)
Creatinine, Ser: 0.85 mg/dL (ref 0.44–1.00)
GFR calc Af Amer: >60 mL/min (ref >60)
GFR calc non Af Amer: >60 mL/min (ref >60)
Glucose, Bld: 153 mg/dL — ABNORMAL HIGH (ref 70–99)
Potassium: 4.1 mmol/L (ref 3.5–5.1)
Sodium: 138 mmol/L (ref 135–145)

## 2019-07-03 LAB — CBC
HCT: 33.6 % — ABNORMAL LOW (ref 36.0–46.0)
Hemoglobin: 10.8 g/dL — ABNORMAL LOW (ref 12.0–15.0)
MCH: 30.6 pg (ref 26.0–34.0)
MCHC: 32.1 g/dL (ref 30.0–36.0)
MCV: 95.2 fL (ref 80.0–100.0)
Platelets: 229 10*3/uL (ref 150–400)
RBC: 3.53 MIL/uL — ABNORMAL LOW (ref 3.87–5.11)
RDW: 13.9 % (ref 11.5–15.5)
WBC: 10.6 10*3/uL — ABNORMAL HIGH (ref 4.0–10.5)
nRBC: 0 % (ref 0.0–0.2)

## 2019-07-03 LAB — HIV ANTIBODY (ROUTINE TESTING W REFLEX): HIV Screen 4th Generation wRfx: NONREACTIVE

## 2019-07-03 MED ORDER — METHOCARBAMOL 750 MG PO TABS
750.0000 mg | ORAL_TABLET | Freq: Three times a day (TID) | ORAL | Status: DC
  Administered 2019-07-03 – 2019-07-04 (×2): 750 mg via ORAL
  Filled 2019-07-03 (×2): qty 1

## 2019-07-03 MED ORDER — IBUPROFEN 400 MG PO TABS: 400.0000 mg | ORAL_TABLET | Freq: Four times a day (QID) | ORAL | Status: DC | PRN

## 2019-07-03 MED ORDER — ACETAMINOPHEN 500 MG PO TABS
1000.0000 mg | ORAL_TABLET | Freq: Four times a day (QID) | ORAL | Status: DC
  Administered 2019-07-03 – 2019-07-04 (×4): 1000 mg via ORAL
  Filled 2019-07-03 (×4): qty 2

## 2019-07-03 MED ORDER — PRAVASTATIN SODIUM 10 MG PO TABS
20.0000 mg | ORAL_TABLET | Freq: Every day | ORAL | Status: DC
  Administered 2019-07-03: 20 mg via ORAL
  Filled 2019-07-03: qty 2

## 2019-07-03 MED ORDER — LIDOCAINE 5 % EX PTCH
1.0000 | MEDICATED_PATCH | CUTANEOUS | Status: DC
  Administered 2019-07-03: 1 via TRANSDERMAL
  Filled 2019-07-03: qty 1

## 2019-07-03 MED ORDER — SODIUM CHLORIDE 0.9 % IV BOLUS
1000.0000 mL | Freq: Once | INTRAVENOUS | Status: AC
  Administered 2019-07-03: 1000 mL via INTRAVENOUS

## 2019-07-03 MED ORDER — IBUPROFEN 400 MG PO TABS: 400.0000 mg | ORAL_TABLET | Freq: Three times a day (TID) | ORAL | Status: DC | PRN

## 2019-07-03 NOTE — Evaluation (Signed)
Physical Therapy Evaluation Patient Details Name: Angel Poole MRN: [id number] DOB: 1955-07-31 Today's Date: 07/03/2019   History of Present Illness  Pt is a 64 y.o. F who was up on a stepladder, lost her balance and fell, striking her right side on a wooden bed frame. Found to have right rib fractures 6-9 and a small PTX.   Clinical Impression  Patient evaluated by Physical Therapy with no further acute PT needs identified. Pt ambulating hallway distances and negotiating a half flight of steps without physical assist. HR 79-88 bpm. Pt with overall fair pain control and reports feeling slightly winded at end of session. Education provided regarding activity recommendations and progression, pillow splinting/ice for pain management, and IS use. Pt pulling ~750 on IS currently. All education has been completed and the patient has no further questions. No follow-up Physical Therapy or equipment needs. PT is signing off. Thank you for this referral.     Follow Up Recommendations No PT follow up    Equipment Recommendations  None recommended by PT    Recommendations for Other Services       Precautions / Restrictions Precautions Precautions: None Restrictions Weight Bearing Restrictions: No      Mobility  Bed Mobility Overal bed mobility: Independent                Transfers Overall transfer level: Independent Equipment used: None                Ambulation/Gait Ambulation/Gait assistance: Independent Gait Distance (Feet): 250 Feet Assistive device: None Gait Pattern/deviations: WFL(Within Functional Limits)     General Gait Details: Slower speed due to painful ribs, no gross imbalance  Stairs Stairs: Yes Stairs assistance: Modified independent (Device/Increase time) Stair Management: One rail Left Number of Stairs: 11 General stair comments: Cues for step by step pattern for pain management/control  Wheelchair Mobility    Modified Rankin (Stroke  Patients Only)       Balance Overall balance assessment: No apparent balance deficits (not formally assessed)                                           Pertinent Vitals/Pain Pain Assessment: Faces Faces Pain Scale: Hurts little more Pain Location: ribs Pain Descriptors / Indicators: Aching Pain Intervention(s): Monitored during session    Home Living Family/patient expects to be discharged to:: Private residence Living Arrangements: Children(son, 49 y.o.) Available Help at Discharge: Family Type of Home: House Home Access: Stairs to enter   Secretary/administrator of Steps: 1 Home Layout: Two level        Prior Function Level of Independence: Independent         Comments: Retired      Higher education careers adviser        Extremity/Trunk Assessment   Upper Extremity Assessment Upper Extremity Assessment: Overall WFL for tasks assessed    Lower Extremity Assessment Lower Extremity Assessment: Overall WFL for tasks assessed    Cervical / Trunk Assessment Cervical / Trunk Assessment: Normal  Communication   Communication: No difficulties  Cognition Arousal/Alertness: Awake/alert Behavior During Therapy: WFL for tasks assessed/performed Overall Cognitive Status: Within Functional Limits for tasks assessed  General Comments      Exercises     Assessment/Plan    PT Assessment Patent does not need any further PT services  PT Problem List         PT Treatment Interventions      PT Goals (Current goals can be found in the Care Plan section)  Acute Rehab PT Goals Patient Stated Goal: return to independence PT Goal Formulation: All assessment and education complete, DC therapy    Frequency     Barriers to discharge        Co-evaluation               AM-PAC PT "6 Clicks" Mobility  Outcome Measure Help needed turning from your back to your side while in a flat bed without using  bedrails?: None Help needed moving from lying on your back to sitting on the side of a flat bed without using bedrails?: None Help needed moving to and from a bed to a chair (including a wheelchair)?: None Help needed standing up from a chair using your arms (e.g., wheelchair or bedside chair)?: None Help needed to walk in hospital room?: None Help needed climbing 3-5 steps with a railing? : None 6 Click Score: 24    End of Session   Activity Tolerance: Patient tolerated treatment well Patient left: in chair;with call bell/phone within reach Nurse Communication: Mobility status PT Visit Diagnosis: Pain Pain - part of body: (right flank)    Time: 7416-3845 PT Time Calculation (min) (ACUTE ONLY): 26 min   Charges:   PT Evaluation $PT Eval Low Complexity: 1 Low PT Treatments $Therapeutic Activity: 8-22 mins          Wyona Almas, PT, DPT Acute Rehabilitation Services Pager [phone] Office [phone]   Deno Etienne 07/03/2019, 9:16 AM

## 2019-07-03 NOTE — Progress Notes (Signed)
[facility] Progress Note     Subjective: CC:  Right posterior chest wall pain, worse with movement and inspiration, improved with pain meds. Patient slightly anxious. Tolerated breakfast without nausea or vomiting. Voiding with no reported urinary sxs. Pulled 750 cc on IS. Lives at home, her son lives with her and works at [employer] second shift - would be able to help her if she needs it.  Supposed to get an MRI of her thoracic spine Tuesday - recommended that she push this back 4 weeks.   Objective: Vital signs in last 24 hours: Temp:  [97.8 F (36.6 C)-98.5 F (36.9 C)] 97.8 F (36.6 C) (04/30 0513) Pulse Rate:  [57-78] 57 (04/30 0513) Resp:  [14-20] 18 (04/30 0513) BP: (94-131)/(56-87) 94/56 (04/30 0513) SpO2:  [92 %-99 %] 95 % (04/30 0513) Weight:  [80.3 kg] 80.3 kg (04/29 1326) Last BM Date: 07/01/19  Intake/Output from previous day: No intake/output data recorded. Intake/Output this shift: Total I/O In: 340 [P.O.:340] Out: -   PE: Gen:  Alert, NAD, pleasant Card:  Regular rate and rhythm, pedal pulses 2+ BL Pulm:  Appropriately tender over right lateral chest wall, Normal effort, clear to auscultation bilaterally Abd: Soft, non-tender, non-distended Skin: warm and dry, no rashes  Psych: A&Ox3   Lab Results:  Recent Labs    07/02/19 2304 07/03/19 0158  WBC 10.8* 10.6*  HGB 11.1* 10.8*  HCT 35.5* 33.6*  PLT 240 229   BMET Recent Labs    07/02/19 1701 07/02/19 1701 07/02/19 2304 07/03/19 0158  NA 140  --   --  138  K 4.4  --   --  4.1  CL 104  --   --  105  CO2 25  --   --  25  GLUCOSE 109*  --   --  153*  BUN 16  --   --  15  CREATININE 0.92   < > 0.90 0.85  CALCIUM 9.6  --   --  8.9   < > = values in this interval not displayed.   PT/INR Recent Labs    07/02/19 1701  LABPROT 12.8  INR 1.0   CMP     Component Value Date/Time   NA 138 07/03/2019 0158   K 4.1 07/03/2019 0158   CL 105 07/03/2019 0158   CO2 25 07/03/2019  0158   GLUCOSE 153 (H) 07/03/2019 0158   BUN 15 07/03/2019 0158   CREATININE 0.85 07/03/2019 0158   CALCIUM 8.9 07/03/2019 0158   PROT 7.5 07/02/2019 1701   ALBUMIN 4.4 07/02/2019 1701   AST 26 07/02/2019 1701   ALT 19 07/02/2019 1701   ALKPHOS 65 07/02/2019 1701   BILITOT 0.4 07/02/2019 1701   GFRNONAA >60 07/03/2019 0158   GFRAA >60 07/03/2019 0158   Lipase  No results found for: LIPASE     Studies/Results: DG Ribs Unilateral W/Chest Right  Result Date: 07/02/2019 CLINICAL DATA:  Right-sided rib pain after fall EXAM: RIGHT RIBS AND CHEST - 3+ VIEW COMPARISON:  None. FINDINGS: Acute minimally displaced fractures of the posterolateral aspect of the right seventh, eighth, and ninth ribs. Possible small right apical pneumothorax. Heart size is normal. No shift of the mediastinal structures to suggest tension component. Left lung is clear. IMPRESSION: 1. Acute minimally displaced fractures of the posterolateral aspect of the right seventh, eighth, and ninth ribs. 2. Possible small right apical pneumothorax. These results were called by telephone at the time of interpretation on 07/02/2019 at  3:21 pm to provider MATTHEW TRIFAN , who verbally acknowledged these results. Electronically Signed   By: Duanne Guess D.O.   On: 07/02/2019 15:21   CT Chest Wo Contrast  Result Date: 07/02/2019 CLINICAL DATA:  Fall off step stool striking right ribs on wooden bed rail. Rib fracture and possible pneumothorax on radiograph. EXAM: CT CHEST WITHOUT CONTRAST TECHNIQUE: Multidetector CT imaging of the chest was performed following the standard protocol without IV contrast. COMPARISON:  Rib radiographs earlier this day. FINDINGS: Cardiovascular: Mild aortic atherosclerosis. No periaortic stranding to suggest injury. Heart is normal in size. There are coronary artery calcifications. No pericardial effusion. Mediastinum/Nodes: No confluent mediastinal contusion. No pneumomediastinum. Decompressed esophagus. No  mediastinal adenopathy. No suspicious thyroid nodule. Lungs/Pleura: Small right pneumothorax present anterior inferiorly is well as superior medially. This is less than 10%. Patchy opacities in the right lower lobe likely combination of atelectasis and contusion. There is dependent atelectasis in the lingula and left lower lobe. No left pneumothorax. No significant pleural effusion. Trachea and mainstem bronchi are patent. Upper Abdomen: No free fluid. No evidence of acute injury allowing for lack of IV contrast. Musculoskeletal: Right lateral sixth through ninth rib fractures. The 6, seventh, and eighth rib fractures are minimally displaced. No additional or segmental rib fracture. The left ribs, sternum, included clavicles and shoulder girdles are intact. Thoracic spine is intact without acute fracture. Mild subcutaneous soft tissue edema at the rib fracture site. IMPRESSION: 1. Right lateral sixth through ninth rib fractures. The sixth, seventh, and eighth rib fractures are minimally displaced. 2. Small right pneumothorax, most prominent anterior inferiorly. Patchy opacities in the right lower lobe likely combination of atelectasis and contusion. 3. Coronary artery calcifications. Aortic Atherosclerosis (ICD10-I70.0). Electronically Signed   By: Narda Rutherford M.D.   On: 07/02/2019 16:14   DG Chest Port 1 View  Result Date: 07/03/2019 CLINICAL DATA:  Pneumothorax on the right EXAM: PORTABLE CHEST 1 VIEW COMPARISON:  Yesterday FINDINGS: No progression of the trace right apical pneumothorax. Mild atelectatic opacity at the bases. Normal heart size. IMPRESSION: 1. No progression of the trace right apical pneumothorax. 2. Increased atelectasis. Electronically Signed   By: Marnee Spring M.D.   On: 07/03/2019 06:13    Anti-infectives: Anti-infectives (From admission, onward)   None     Assessment/Plan Fall off stepladder Right rib fracture 6-9 with small pneumothorax - CXR with stable right apical  PTX, some atelectasis, continue multimodal pain control and pulm toilet. Add advil and lidoderm.   FEN: HH, IV saline locked ID: none VTE: SCD's, Lovenox Dispo: PT recommends home with no PT needs.   Anticipate discharge home this afternoon if pain remains controlled on oral medications and vitals are WNL.    LOS: 1 day    Hosie Spangle, [facility]

## 2019-07-03 NOTE — TOC Initial Note (Addendum)
Transition of Care [facility]) - Initial/Assessment Note    Patient Details  Name: Angel Poole MRN: [id number] Date of Birth: [birth year]  Transition of Care [facility]) CM/SW Contact:    Emeterio Reeve, [place] Phone Number: 07/03/2019, 11:09 AM  Clinical Narrative:                  CSW met with pt at bedside. CSW introduced self and explained her role at the hospital. Pt stated that PTA she lives in a two story home with her adult son. Pt stated stated that she was completely independent and has a job and still drives. Pt stated that her son will be able to provide support after discharge.   Pt was seen earlier by PT. PT has signed off and is not reccommending any services at discharge. CSW will continue to follow for OT recommendations.    CSW completed sbirt with pt.  Pt scored a 0 on the sbirt scale. Pt denied alcohol use. Pt denied substance use. Pt did not need resources at this time.   Expected Discharge Plan: [place] Barriers to Discharge: Continued Medical Work up   Patient Goals and CMS Choice Patient states their goals for this hospitalization and ongoing recovery are:: "To go back home"      Expected Discharge Plan and Services Expected Discharge Plan: [place]       Living arrangements for the past 2 months: Single Family Home                                      Prior Living Arrangements/Services Living arrangements for the past 2 months: Single Family Home Lives with:: Adult Children Patient language and need for interpreter reviewed:: Yes Do you feel safe going back to the place where you live?: Yes      Need for Family Participation in Patient Care: Yes (Comment) Care giver support system in place?: Yes (comment)   Criminal Activity/Legal Involvement Pertinent to Current Situation/Hospitalization: No - Comment as needed  Activities of Daily Living Home Assistive Devices/Equipment: None ADL Screening  (condition at time of admission) Patient's cognitive ability adequate to safely complete daily activities?: Yes Is the patient deaf or have difficulty hearing?: No Does the patient have difficulty seeing, even when wearing glasses/contacts?: No Does the patient have difficulty concentrating, remembering, or making decisions?: No Patient able to express need for assistance with ADLs?: Yes Does the patient have difficulty dressing or bathing?: No Independently performs ADLs?: Yes (appropriate for developmental age) Does the patient have difficulty walking or climbing stairs?: Yes Weakness of Legs: None Weakness of Arms/Hands: None  Permission Sought/Granted                  Emotional Assessment Appearance:: Appears stated age Attitude/Demeanor/Rapport: Engaged Affect (typically observed): Appropriate Orientation: : Oriented to Self, Oriented to Place, Oriented to  Time, Oriented to Situation Alcohol / Substance Use: Not Applicable Psych Involvement: No (comment)  Admission diagnosis:  Pneumothorax [J93.9] Traumatic pneumothorax, initial encounter [S27.0XXA] Closed fracture of multiple ribs of right side, initial encounter [S22.41XA] Rib fractures [S22.39XA] Patient Active Problem List   Diagnosis Date Noted  . Pneumothorax 07/02/2019  . Rib fractures 07/02/2019   PCP:  Bernerd Limbo, MD Pharmacy:   [place], [place] - [address] [place] [place] [address] Phone: [phone] Fax: [phone]  Social Determinants of Health (SDOH) Interventions    Readmission Risk Interventions No flowsheet data found.  Emeterio Reeve, Latanya Presser, [place] Social Worker [phone]

## 2019-07-04 MED ORDER — IBUPROFEN 400 MG PO TABS: 400.0000 mg | ORAL_TABLET | Freq: Three times a day (TID) | ORAL | 0 refills | Status: DC | PRN

## 2019-07-04 MED ORDER — ACETAMINOPHEN 500 MG PO TABS: 1000.0000 mg | ORAL_TABLET | Freq: Four times a day (QID) | ORAL | 0 refills | Status: DC | PRN

## 2019-07-04 MED ORDER — OXYCODONE HCL 5 MG PO TABS: 5.0000 mg | ORAL_TABLET | Freq: Four times a day (QID) | ORAL | 0 refills | Status: DC | PRN

## 2019-07-04 NOTE — Discharge Instructions (Signed)
RIB FRACTURES  HOME INSTRUCTIONS   1. PAIN CONTROL:  1. Pain is best controlled by a usual combination of three different methods TOGETHER:  i. Ice/Heat ii. Over the counter pain medication iii. Prescription pain medication 2. You may experience some swelling and bruising in area of broken ribs. Ice packs or heating pads (30-60 minutes up to 6 times a day) will help. Use ice for the first few days to help decrease swelling and bruising, then switch to heat to help relax tight/sore spots and speed recovery. Some people prefer to use ice alone, heat alone, alternating between ice & heat. Experiment to what works for you. Swelling and bruising can take several weeks to resolve.  3. It is helpful to take an over-the-counter pain medication regularly for the first few weeks. Choose one of the following that works best for you:  i. Naproxen (Aleve, etc) Two 220mg tabs twice a day ii. Ibuprofen (Advil, etc) Three 200mg tabs four times a day (every meal & bedtime) iii. Acetaminophen (Tylenol, etc) 500-650mg four times a day (every meal & bedtime) 4. A prescription for pain medication (such as oxycodone, hydrocodone, etc) may be given to you upon discharge. Take your pain medication as prescribed.  i. If you are having problems/concerns with the prescription medicine (does not control pain, nausea, vomiting, rash, itching, etc), please call us [phone] to see if we need to switch you to a different pain medicine that will work better for you and/or control your side effect better. ii. If you need a refill on your pain medication, please contact your pharmacy. They will contact our office to request authorization. Prescriptions will not be filled after 5 pm or on week-ends. 1. Avoid getting constipated. When taking pain medications, it is common to experience some constipation. Increasing fluid intake and taking a fiber supplement (such as Metamucil, Citrucel, FiberCon, MiraLax, etc) 1-2 times a day  regularly will usually help prevent this problem from occurring. A mild laxative (prune juice, Milk of Magnesia, MiraLax, etc) should be taken according to package directions if there are no bowel movements after 48 hours.  2. Watch out for diarrhea. If you have many loose bowel movements, simplify your diet to bland foods & liquids for a few days. Stop any stool softeners and decrease your fiber supplement. Switching to mild anti-diarrheal medications (Kayopectate, Pepto Bismol) can help. If this worsens or does not improve, please call us. 3. FOLLOW UP  a. If a follow up appointment is needed one will be scheduled for you. If none is needed with our trauma team, please follow up with your primary care provider within 2-3 weeks from discharge. Please call [facility] at [phone] if you have any questions about follow up.  b. If you have any orthopedic or other injuries you will need to follow up as outlined in your follow up instructions.   WHEN TO CALL US [phone]:  1. Poor pain control 2. Reactions / problems with new medications (rash/itching, nausea, etc)  3. Fever over 101.5 F (38.5 C) 4. Worsening swelling or bruising 5. Worsening pain, productive cough, difficulty breathing or any other concerning symptoms  The clinic staff is available to answer your questions during regular business hours (8:30am-5pm). Please don't hesitate to call and ask to speak to one of our nurses for clinical concerns.  If you have a medical emergency, go to the nearest emergency room or call 911.  A surgeon from [facility] is always on call   at the hospitals   [facility]  [address], [address], [place], [place] [address] ?  MAIN: [phone] ? TOLL FREE: [phone] ?  FAX [phone]  [email or web address]      Information on Rib Fractures  A rib fracture is a break or crack in one of the bones of the ribs. The ribs are long, curved bones that  wrap around your chest and attach to your spine and your breastbone. The ribs protect your heart, lungs, and other organs in the chest. A broken or cracked rib is often painful but is not usually serious. Most rib fractures heal on their own over time. However, rib fractures can be more serious if multiple ribs are broken or if broken ribs move out of place and push against other structures or organs. What are the causes? This condition is caused by:  Repetitive movements with high force, such as pitching a baseball or having severe coughing spells.  A direct blow to the chest, such as a sports injury, a car accident, or a fall.  Cancer that has spread to the bones, which can weaken bones and cause them to break. What are the signs or symptoms? Symptoms of this condition include:  Pain when you breathe in or cough.  Pain when someone presses on the injured area.  Feeling short of breath. How is this diagnosed? This condition is diagnosed with a physical exam and medical history. Imaging tests may also be done, such as:  Chest X-ray.  CT scan.  MRI.  Bone scan.  Chest ultrasound. How is this treated? Treatment for this condition depends on the severity of the fracture. Most rib fractures usually heal on their own in 1-3 months. Sometimes healing takes longer if there is a cough that does not stop or if there are other activities that make the injury worse (aggravating factors). While you heal, you will be given medicines to control the pain. You will also be taught deep breathing exercises. Severe injuries may require hospitalization or surgery. Follow these instructions at home: Managing pain, stiffness, and swelling  If directed, apply ice to the injured area. ? Put ice in a plastic bag. ? Place a towel between your skin and the bag. ? Leave the ice on for 20 minutes, 2-3 times a day.  Take over-the-counter and prescription medicines only as told by your health care  provider. Activity  Avoid a lot of activity and any activities or movements that cause pain. Be careful during activities and avoid bumping the injured rib.  Slowly increase your activity as told by your health care provider. General instructions  Do deep breathing exercises as told by your health care provider. This helps prevent pneumonia, which is a common complication of a broken rib. Your health care provider may instruct you to: ? Take deep breaths several times a day. ? Try to cough several times a day, holding a pillow against the injured area. ? Use a device called incentive spirometer to practice deep breathing several times a day.  Drink enough fluid to keep your urine pale yellow.  Do not wear a rib belt or binder. These restrict breathing, which can lead to pneumonia.  Keep all follow-up visits as told by your health care provider. This is important. Contact a health care provider if:  You have a fever. Get help right away if:  You have difficulty breathing or you are short of breath.  You develop a cough that does   not stop, or you cough up thick or bloody sputum.  You have nausea, vomiting, or pain in your abdomen.  Your pain gets worse and medicine does not help. Summary  A rib fracture is a break or crack in one of the bones of the ribs.  A broken or cracked rib is often painful but is not usually serious.  Most rib fractures heal on their own over time.  Treatment for this condition depends on the severity of the fracture.  Avoid a lot of activity and any activities or movements that cause pain. This information is not intended to replace advice given to you by your health care provider. Make sure you discuss any questions you have with your health care provider. Document Released: 02/19/2005 Document Revised: 05/21/2016 Document Reviewed: 05/21/2016 Elsevier Interactive Patient Education  2019 Elsevier Inc.  

## 2019-07-04 NOTE — Progress Notes (Signed)
Patient discharged to home. Verbalizes understanding of all discharge instructions including discharge medications and follow up MD visits.  

## 2019-07-04 NOTE — Discharge Summary (Signed)
[facility] Discharge Summary   Patient ID: Angel Poole MRN: [id number] DOB/AGE: 65/01/1956 64 y.o.  Admit date: 07/02/2019 Discharge date: 07/04/2019  Admitting Diagnosis: Fall off stepladder Right rib fracture 6-9 with small pneumothorax  Discharge Diagnosis Patient Active Problem List   Diagnosis Date Noted  . Pneumothorax 07/02/2019  . Rib fractures 07/02/2019    Consultants None  Imaging: DG Ribs Unilateral W/Chest Right  Result Date: 07/02/2019 CLINICAL DATA:  Right-sided rib pain after fall EXAM: RIGHT RIBS AND CHEST - 3+ VIEW COMPARISON:  None. FINDINGS: Acute minimally displaced fractures of the posterolateral aspect of the right seventh, eighth, and ninth ribs. Possible small right apical pneumothorax. Heart size is normal. No shift of the mediastinal structures to suggest tension component. Left lung is clear. IMPRESSION: 1. Acute minimally displaced fractures of the posterolateral aspect of the right seventh, eighth, and ninth ribs. 2. Possible small right apical pneumothorax. These results were called by telephone at the time of interpretation on 07/02/2019 at 3:21 pm to provider MATTHEW TRIFAN , who verbally acknowledged these results. Electronically Signed   By: Davina Poke D.O.   On: 07/02/2019 15:21   CT Chest Wo Contrast  Result Date: 07/02/2019 CLINICAL DATA:  Fall off step stool striking right ribs on wooden bed rail. Rib fracture and possible pneumothorax on radiograph. EXAM: CT CHEST WITHOUT CONTRAST TECHNIQUE: Multidetector CT imaging of the chest was performed following the standard protocol without IV contrast. COMPARISON:  Rib radiographs earlier this day. FINDINGS: Cardiovascular: Mild aortic atherosclerosis. No periaortic stranding to suggest injury. Heart is normal in size. There are coronary artery calcifications. No pericardial effusion. Mediastinum/Nodes: No confluent mediastinal contusion. No pneumomediastinum. Decompressed  esophagus. No mediastinal adenopathy. No suspicious thyroid nodule. Lungs/Pleura: Small right pneumothorax present anterior inferiorly is well as superior medially. This is less than 10%. Patchy opacities in the right lower lobe likely combination of atelectasis and contusion. There is dependent atelectasis in the lingula and left lower lobe. No left pneumothorax. No significant pleural effusion. Trachea and mainstem bronchi are patent. Upper Abdomen: No free fluid. No evidence of acute injury allowing for lack of IV contrast. Musculoskeletal: Right lateral sixth through ninth rib fractures. The 6, seventh, and eighth rib fractures are minimally displaced. No additional or segmental rib fracture. The left ribs, sternum, included clavicles and shoulder girdles are intact. Thoracic spine is intact without acute fracture. Mild subcutaneous soft tissue edema at the rib fracture site. IMPRESSION: 1. Right lateral sixth through ninth rib fractures. The sixth, seventh, and eighth rib fractures are minimally displaced. 2. Small right pneumothorax, most prominent anterior inferiorly. Patchy opacities in the right lower lobe likely combination of atelectasis and contusion. 3. Coronary artery calcifications. Aortic Atherosclerosis (ICD10-I70.0). Electronically Signed   By: Keith Rake M.D.   On: 07/02/2019 16:14   DG Chest Port 1 View  Result Date: 07/03/2019 CLINICAL DATA:  Pneumothorax on the right EXAM: PORTABLE CHEST 1 VIEW COMPARISON:  Yesterday FINDINGS: No progression of the trace right apical pneumothorax. Mild atelectatic opacity at the bases. Normal heart size. IMPRESSION: 1. No progression of the trace right apical pneumothorax. 2. Increased atelectasis. Electronically Signed   By: Monte Fantasia M.D.   On: 07/03/2019 06:13    Procedures None  Hospital Course:  Angel Poole is a 64yo female who was transferred from [facility] to [facility] for admission to the trauma service after falling off a stepladder  changing the filter in her air purifier.  As she was stepping down,  she lost her balance and fell, striking her right side on the wooden bed frame.  No loss of consciousness.  She was evaluated at [facility].  She was found to have right rib fractures 6-9 and a small pneumothorax.  She was accepted in transfer for observation and pain control. Follow up chest xray stable. Patient worked with and was cleared by physical therapy. On 5/1 the patient was ambulating well, pain well controlled, vital signs stable and felt stable for discharge home.  Patient will follow up as below and knows to call with questions or concerns.    I have personally reviewed the patients medication history on the [place] controlled substance database.    Physical Exam: General:  Alert, NAD, pleasant, comfortable Pulm: CTAB, rate and effort normal, no wheezing or rhonchi, pulling 1000 on IS Cardio: RRR Abd:  Soft, ND, NT Skin: warm and dry  Allergies as of 07/04/2019      Reactions   Tetracyclines & Related Rash      Medication List    TAKE these medications   acetaminophen 500 MG tablet Commonly known as: TYLENOL Take 2 tablets (1,000 mg total) by mouth every 6 (six) hours as needed for mild pain.   Advanced Probiotic-14 Caps Take 1 capsule by mouth daily.   albuterol 108 (90 Base) MCG/ACT inhaler Commonly known as: VENTOLIN HFA Inhale 1 puff into the lungs every 6 (six) hours as needed for wheezing or shortness of breath.   B-complex with vitamin C tablet Take 1 tablet by mouth daily.   calcium-vitamin D 500-200 MG-UNIT Tabs tablet Commonly known as: OSCAL WITH D Take 1 tablet by mouth 2 (two) times daily.   CoQ-10 100 MG Caps Take 100 mg by mouth daily.   cyclobenzaprine 5 MG tablet Commonly known as: FLEXERIL Take 5 mg by mouth 3 (three) times daily as needed for muscle spasms.   fexofenadine 180 MG tablet Commonly known as: ALLEGRA Take 180 mg by mouth daily.   ibuprofen 400 MG  tablet Commonly known as: ADVIL Take 1-2 tablets (400-800 mg total) by mouth every 8 (eight) hours as needed for headache, mild pain or moderate pain.   Lysine 500 MG Tabs Take 500 mg by mouth daily.   magnesium gluconate 500 MG tablet Commonly known as: MAGONATE Take 500 mg by mouth daily.   omeprazole 40 MG capsule Commonly known as: PRILOSEC Take 40 mg by mouth every morning.   oxyCODONE 5 MG immediate release tablet Commonly known as: Oxy IR/ROXICODONE Take 1 tablet (5 mg total) by mouth every 6 (six) hours as needed for severe pain.   pravastatin 20 MG tablet Commonly known as: PRAVACHOL Take 20 mg by mouth at bedtime.   Red Yeast Rice 600 MG Tabs Take 600 mg by mouth daily.   Vitamin D 50 MCG (2000 UT) Caps Take 2,000 Units by mouth daily.   Yuvafem 10 MCG Tabs vaginal tablet Generic drug: Estradiol Place 1 tablet (10 mcg total) vaginally 2 (two) times a week. What changed: additional instructions        Follow-up Information    Tracey Harries, MD. Schedule an appointment as soon as possible for a visit in 1 week(s).   Specialty: Family Medicine Why: call to arrange follow up Contact information: [address] [place] [address] [phone]        [facility] [place]. Call.   Why: as needed Contact information: [address] [place]  [place] [address] [phone]          Signed: Franne Forts, [facility] 07/04/2019, 10:54 AM Please see Amion for pager number during day hours 7:00am-4:30pm

## 2019-07-07 ENCOUNTER — Telehealth: Payer: Self-pay | Admitting: *Deleted

## 2019-07-07 NOTE — Telephone Encounter (Signed)
Please remove from mammogram hold and return to routine screening.  

## 2019-07-07 NOTE — Telephone Encounter (Signed)
Patient is in MMG hold for recall from screening MMG at [facility] on 05/04/19, asymmetry right breast.   DX MMG of right breast 05/20/19 IMPRESSION: No evidence of malignancy. The recently suspected right breast asymmetry was close apposition of normal breast tissue.  RECOMMENDATION: Bilateral screening mammogram in 1 year.   Dr. Edward Jolly -ok to remove from [facility] hold?

## 2019-07-08 NOTE — Telephone Encounter (Signed)
Removed from MMG hold.   Encounter closed.  

## 2019-09-01 ENCOUNTER — Telehealth: Payer: Self-pay | Admitting: Obstetrics and Gynecology

## 2019-09-01 NOTE — Telephone Encounter (Signed)
Patient has started bleeding. °

## 2019-09-01 NOTE — Telephone Encounter (Signed)
AEX 04/2019 H/O vaginal atrophy, on Vagifem Rx  Spoke with pt. Pt states having post coital light spotting that lasted a day x 2 days ago. Pt states no bleeding today, but thinks might have a yeast infection starting. Pt states having vaginal itching, denies discharge, odor. Pt states did not wear pad or panty liner while having post coital spotting, only when wiping.  Denies abd pain/cramping, fever, chills.   Advised pt to have OV for further evaluation. Pt agreeable. Pt scheduled with Dr Edward Jolly on 6/30 at 10 am. Pt agreeable and verbalized understanding of date and time of appt. CPS neg. Pt is fully Covid vaccinated.   Routing to Dr Edward Jolly for review.  Encounter closed.

## 2019-09-02 ENCOUNTER — Ambulatory Visit (INDEPENDENT_AMBULATORY_CARE_PROVIDER_SITE_OTHER): Payer: No Typology Code available for payment source | Admitting: Obstetrics and Gynecology

## 2019-09-02 ENCOUNTER — Other Ambulatory Visit (HOSPITAL_COMMUNITY)
Admission: RE | Admit: 2019-09-02 | Discharge: 2019-09-02 | Disposition: A | Discharge status: Home / Self Care | Payer: PRIVATE HEALTH INSURANCE | Source: Ambulatory Visit | Attending: Obstetrics and Gynecology | Admitting: Obstetrics and Gynecology

## 2019-09-02 ENCOUNTER — Other Ambulatory Visit: Payer: Self-pay

## 2019-09-02 ENCOUNTER — Encounter: Payer: Self-pay | Admitting: Obstetrics and Gynecology

## 2019-09-02 VITALS — BP 110/70 | HR 64 | Ht 63.25 in | Wt 168.6 lb

## 2019-09-02 DIAGNOSIS — Z124 Encounter for screening for malignant neoplasm of cervix: Secondary | ICD-10-CM | POA: Insufficient documentation

## 2019-09-02 DIAGNOSIS — Z113 Encounter for screening for infections with a predominantly sexual mode of transmission: Secondary | ICD-10-CM

## 2019-09-02 DIAGNOSIS — N760 Acute vaginitis: Secondary | ICD-10-CM

## 2019-09-02 DIAGNOSIS — N930 Postcoital and contact bleeding: Secondary | ICD-10-CM

## 2019-09-02 NOTE — Progress Notes (Signed)
GYNECOLOGY  VISIT   HPI: 64 y.o.   Widowed  Caucasian  female   G2P1010 with Patient's last menstrual period was 03/05/2008 (approximate).   here for post coital bleeding 4 days ago.  Symptoms started 2 days later.   She also complains of vaginal irritation. Some dryness with intercourse.  No condom use.   In a new relationship. Spotted for 2 days after intercourse, and this has decreased.  No bleeding outside of this.  This was her first sexual activity in 17 years.   She feels like the beginning of a yeast infection.  Feels some burning sensation.  Does have discharge and odor.   Denies dysuria.   Still using vagifem tablet twice a week.   Larey Seat and broke her ribs.  She had a pneumothorax which resolved spontaneously.   GYNECOLOGIC HISTORY: Patient's last menstrual period was 03/05/2008 (approximate). Contraception:  PMP Menopausal hormone therapy:  Vagifem vaginal estrogen.  Last mammogram: 3-1-21Rt.Br.poss asymmetry, Lt.Br.Neg/density C/Rt.Br.Diag.Neg/screening 72yr./BiRads1 Last pap smear:  03-31-18 Neg:Neg HR HPV,  03/2017 normal per patient        OB History    Gravida  2   Para  1   Term  1   Preterm      AB  1   Living        SAB      TAB      Ectopic      Multiple      Live Births                 Patient Active Problem List   Diagnosis Date Noted  . Pneumothorax 07/02/2019  . Rib fractures 07/02/2019    Past Medical History:  Diagnosis Date  . Elevated cholesterol     Past Surgical History:  Procedure Laterality Date  . CATARACT EXTRACTION, BILATERAL    . CESAREAN SECTION  1983   [place]  . DILATION AND CURETTAGE OF UTERUS      Current Outpatient Medications  Medication Sig Dispense Refill  . acetaminophen (TYLENOL) 500 MG tablet Take 2 tablets (1,000 mg total) by mouth every 6 (six) hours as needed for mild pain. 30 tablet 0  . albuterol (PROVENTIL HFA;VENTOLIN HFA) 108 (90 Base) MCG/ACT inhaler Inhale 1 puff into the lungs every  6 (six) hours as needed for wheezing or shortness of breath.     Marland Kitchen aspirin 81 MG EC tablet Take by mouth.    . B Complex-C (B-COMPLEX WITH VITAMIN C) tablet Take 1 tablet by mouth daily.    . calcium-vitamin D (OSCAL WITH D) 500-200 MG-UNIT TABS tablet Take 1 tablet by mouth 2 (two) times daily.     . Cholecalciferol (VITAMIN D) 50 MCG (2000 UT) CAPS Take 2,000 Units by mouth daily.     . Coenzyme Q10 (COQ-10) 100 MG CAPS Take 100 mg by mouth daily.     . cyclobenzaprine (FLEXERIL) 5 MG tablet Take 5 mg by mouth 3 (three) times daily as needed for muscle spasms.    . fexofenadine (ALLEGRA) 180 MG tablet Take 180 mg by mouth daily.     Marland Kitchen ibuprofen (ADVIL) 400 MG tablet Take 1-2 tablets (400-800 mg total) by mouth every 8 (eight) hours as needed for headache, mild pain or moderate pain. 30 tablet 0  . Lysine 500 MG TABS Take 500 mg by mouth daily.     . magnesium gluconate (MAGONATE) 500 MG tablet Take 500 mg by mouth daily. Takes 800 mg daily    .  omeprazole (PRILOSEC) 40 MG capsule Take 40 mg by mouth every morning.    . pravastatin (PRAVACHOL) 20 MG tablet Take 20 mg by mouth at bedtime.     . Probiotic Product (ADVANCED PROBIOTIC-14) CAPS Take 1 capsule by mouth daily.    . Red Yeast Rice 600 MG TABS Take 600 mg by mouth daily.     Anson Fret 10 MCG TABS vaginal tablet Place 1 tablet (10 mcg total) vaginally 2 (two) times a week. (Patient taking differently: Place 10 mcg vaginally 2 (two) times a week. Tuesday and Friday) 24 tablet 3   No current facility-administered medications for this visit.     ALLERGIES: Tetracyclines & related  Family History  Problem Relation Age of Onset  . Hyperlipidemia Mother   . Hyperlipidemia Father   . Breast cancer Maternal Aunt   . Diabetes Maternal Grandfather   . Hypertension Sister     Social History   Socioeconomic History  . Marital status: Widowed    Spouse name: Not on file  . Number of children: Not on file  . Years of education: Not on  file  . Highest education level: Not on file  Occupational History  . Not on file  Tobacco Use  . Smoking status: Never Smoker  . Smokeless tobacco: Never Used  Vaping Use  . Vaping Use: Never used  Substance and Sexual Activity  . Alcohol use: Not Currently  . Drug use: Never  . Sexual activity: Not Currently    Birth control/protection: Post-menopausal  Other Topics Concern  . Not on file  Social History Narrative  . Not on file   Social Determinants of Health   Financial Resource Strain:   . Difficulty of Paying Living Expenses:   Food Insecurity:   . Worried About Programme researcher, broadcasting/film/video in the Last Year:   . Barista in the Last Year:   Transportation Needs:   . Freight forwarder (Medical):   Marland Kitchen Lack of Transportation (Non-Medical):   Physical Activity:   . Days of Exercise per Week:   . Minutes of Exercise per Session:   Stress:   . Feeling of Stress :   Social Connections:   . Frequency of Communication with Friends and Family:   . Frequency of Social Gatherings with Friends and Family:   . Attends Religious Services:   . Active Member of Clubs or Organizations:   . Attends Banker Meetings:   Marland Kitchen Marital Status:   Intimate Partner Violence:   . Fear of Current or Ex-Partner:   . Emotionally Abused:   Marland Kitchen Physically Abused:   . Sexually Abused:     Review of Systems  All other systems reviewed and are negative.   PHYSICAL EXAMINATION:    BP 110/70   Pulse 64   Ht 5' 3.25" (1.607 m)   Wt 168 lb 9.6 oz (76.5 kg)   LMP 03/05/2008 (Approximate)   BMI 29.63 kg/m     General appearance: alert, cooperative and appears stated age  Pelvic: External genitalia:  Petechiae of the vestibule bilaterally.               Urethra:  normal appearing urethra with no masses, tenderness or lesions              Bartholins and Skenes: normal                 Vagina: normal appearing vagina with normal color and discharge, no  lesions               Cervix: no lesions                Bimanual Exam:  Uterus:  normal size, contour, position, consistency, mobility, non-tender              Adnexa: no mass, fullness, tenderness            Chaperone was present for exam.  ASSESSMENT  Post coital bleeding, delayed after intercourse. Recent resumption of coital activity.  Atrophy.  STD screening.  Vaginitis.  Cervical cancer screening.   PLAN  STD screening and vaginitis testing.  Pap and HR HPV testing. Will continue Vagifem.  Will consider switching to vaginal estrogen cream so we have more flexibility in dosing.  Return for pelvic US to rule out other causes - polyps, ovarian cysts, malignancy.  Patient understands the importance.  __29____ minutes counseling.

## 2019-09-02 NOTE — Patient Instructions (Signed)
Postmenopausal Bleeding  Postmenopausal bleeding is any bleeding that a woman has after she has entered into menopause. Menopause is the end of a woman's fertile years. After menopause, a woman no longer ovulates and does not have menstrual periods. Postmenopausal bleeding may have various causes, including:  Menopausal hormone therapy (MHT).  Endometrial atrophy. After menopause, low estrogen hormone levels cause the membrane that lines the uterus (endometrium) to become thinner. You may have bleeding as the endometrium thins.  Endometrial hyperplasia. This condition is caused by excess estrogen hormones and low levels of progesterone hormones. The excess estrogen causes the endometrium to thicken, which can lead to bleeding. In some cases, this can lead to cancer of the uterus.  Endometrial cancer.  Non-cancerous growths (polyps) on the endometrium, the lining of the uterus, or the cervix.  Uterine fibroids. These are non-cancerous growths in or around the uterus muscle tissue that can cause heavy bleeding. Any type of postmenopausal bleeding, even if it appears to be a typical menstrual period, should be evaluated by your health care provider. Treatment will depend on the cause of the bleeding. Follow these instructions at home:  Pay attention to any changes in your symptoms.  Avoid using tampons and douches as told by your health care provider.  Change your pads regularly.  Get regular pelvic exams and Pap tests.  Take iron supplements as told by your health care provider.  Take over-the-counter and prescription medicines only as told by your health care provider.  Keep all follow-up visits as told by your health care provider. This is important. Contact a health care provider if:  Your bleeding lasts more than 1 week.  You have abdominal pain.  You have bleeding with or after sexual intercourse.  You have bleeding that happens more often than every 3 weeks. Get help  right away if:  You have a fever, chills, headache, dizziness, muscle aches, and bleeding.  You have severe pain with bleeding.  You are passing blood clots.  You have heavy bleeding, need more than 1 pad an hour, and have never experienced this before.  You feel faint. Summary  Postmenopausal bleeding is any bleeding that a woman has after she has entered into menopause.  Postmenopausal bleeding may have various causes. Treatment will depend on the cause of the bleeding.  Any type of postmenopausal bleeding, even if it appears to be a typical menstrual period, should be evaluated by your health care provider.  Be sure to pay attention to any changes in your symptoms and keep all follow-up visits as told by your health care provider. This information is not intended to replace advice given to you by your health care provider. Make sure you discuss any questions you have with your health care provider. Document Revised: 05/29/2017 Document Reviewed: 05/15/2016 Elsevier Patient Education  2020 Elsevier Inc.  

## 2019-09-03 ENCOUNTER — Telehealth: Payer: Self-pay | Admitting: Obstetrics and Gynecology

## 2019-09-03 LAB — HEP, RPR, HIV PANEL
HIV Screen 4th Generation wRfx: NONREACTIVE
Hepatitis B Surface Ag: NEGATIVE
RPR Ser Ql: NONREACTIVE

## 2019-09-03 LAB — CYTOLOGY - PAP
Comment: NEGATIVE
Diagnosis: NEGATIVE
High risk HPV: NEGATIVE

## 2019-09-03 LAB — CERVICOVAGINAL ANCILLARY ONLY
Bacterial Vaginitis (gardnerella): NEGATIVE
Candida Glabrata: NEGATIVE
Candida Vaginitis: NEGATIVE
Chlamydia: NEGATIVE
Comment: NEGATIVE
Comment: NEGATIVE
Comment: NEGATIVE
Comment: NEGATIVE
Comment: NEGATIVE
Comment: NORMAL
Neisseria Gonorrhea: NEGATIVE
Trichomonas: NEGATIVE

## 2019-09-03 LAB — HEPATITIS C ANTIBODY: Hep C Virus Ab: <0.1 s/co ratio (ref 0.0–0.9)

## 2019-09-03 NOTE — Telephone Encounter (Signed)
Spoke with patient regarding benefits for recommended ultrasound. Patient is aware that ultrasound is transvaginal. Patient acknowledges understanding of information presented. Patient is aware of cancellation policy. Patient scheduled appointment for 10/08/2019 at 1000AM with Brook A. Edward Jolly, MD, [facility].   Patient stated that she will be getting new insurance effective 10/04/2019. Informed patient that updated benefits will be received prior to appointment. Informed patient to expect return call early that week to review new benefits. Patient is agreeable.  Encounter closed.

## 2019-10-01 ENCOUNTER — Other Ambulatory Visit: Payer: Self-pay | Admitting: *Deleted

## 2019-10-01 ENCOUNTER — Telehealth: Payer: Self-pay

## 2019-10-01 DIAGNOSIS — N930 Postcoital and contact bleeding: Secondary | ICD-10-CM

## 2019-10-01 NOTE — Telephone Encounter (Signed)
Patient cancelled [place] for (10/08/19) due to "personal matter". Patient stated she would call back to reschedule.

## 2019-10-01 NOTE — Telephone Encounter (Signed)
Spoke with patient. PUS for post coital bleeding r/s to 10/15/19 at 4pm, consult to follow at 4:30pm with Dr. Edward Jolly. Patient verbalizes understanding and is agreeable.   Routing to provider for final review. Patient is agreeable to disposition. Will close encounter.  Cc: Hayley Carder

## 2019-10-05 ENCOUNTER — Telehealth: Payer: Self-pay | Admitting: Obstetrics and Gynecology

## 2019-10-05 NOTE — Telephone Encounter (Signed)
Patient returned call

## 2019-10-05 NOTE — Telephone Encounter (Signed)
Spoke with patient regarding benefits for recommended ultrasound. Patient is aware that ultrasound is transvaginal. Patient acknowledges understanding of information presented. Patient is aware of cancellation policy. Encounter closed. °

## 2019-10-05 NOTE — Telephone Encounter (Signed)
Call to patient. Per DPR, OK to leave message on voicemail.   Left voicemail requesting a return call to Hayley to review benefits for scheduled Pelvic ultrasound with Brook A. Silva, MD, FACOG. 

## 2019-10-08 ENCOUNTER — Other Ambulatory Visit: Payer: No Typology Code available for payment source | Admitting: Obstetrics and Gynecology

## 2019-10-08 ENCOUNTER — Other Ambulatory Visit: Payer: No Typology Code available for payment source

## 2019-10-15 ENCOUNTER — Other Ambulatory Visit: Payer: Self-pay

## 2019-10-15 ENCOUNTER — Other Ambulatory Visit: Payer: Self-pay | Admitting: Obstetrics and Gynecology

## 2019-10-22 ENCOUNTER — Ambulatory Visit: Payer: Medicare Other | Admitting: Obstetrics and Gynecology

## 2019-10-22 ENCOUNTER — Other Ambulatory Visit: Payer: Self-pay

## 2019-10-22 ENCOUNTER — Ambulatory Visit (INDEPENDENT_AMBULATORY_CARE_PROVIDER_SITE_OTHER): Payer: Medicare Other

## 2019-10-22 ENCOUNTER — Encounter: Payer: Self-pay | Admitting: Obstetrics and Gynecology

## 2019-10-22 VITALS — BP 122/76 | HR 70 | Ht 63.25 in | Wt 172.0 lb

## 2019-10-22 DIAGNOSIS — N930 Postcoital and contact bleeding: Secondary | ICD-10-CM

## 2019-10-22 DIAGNOSIS — N952 Postmenopausal atrophic vaginitis: Secondary | ICD-10-CM | POA: Diagnosis not present

## 2019-10-22 NOTE — Progress Notes (Signed)
GYNECOLOGY  VISIT   HPI: 64 y.o.   Widowed  Caucasian  female   G2P1011 with Patient's last menstrual period was 03/05/2008 (approximate).   here for pelvic ultrasound.    Patient seen 09/02/19 for post coital bleeding the next day.  No further bleeding.  She has recently become sexually active again.  Relationship has ended.   No additional bleeding.   She is using Vagifem.  She has questions about atrophic vaginitis.   She tested negative for yeast, BV, trich, gonorrhea and chlamydia.   GYNECOLOGIC HISTORY: Patient's last menstrual period was 03/05/2008 (approximate). Contraception: PMP Menopausal hormone therapy:  Vagifem Last mammogram: 3-1-21Rt.Br.poss asymmetry, Lt.Br.Neg/density C/Rt.Br.Diag.Neg/screening 31yr./BiRads1 Last pap smear: 09-02-19 Neg:Neg HR HPV,03-31-18 Neg:Neg HR HPV, 03/2017 normal per patient        OB History    Gravida  2   Para  1   Term  1   Preterm      AB  1   Living        SAB      TAB      Ectopic      Multiple      Live Births                 Patient Active Problem List   Diagnosis Date Noted  . Pneumothorax 07/02/2019  . Rib fractures 07/02/2019    Past Medical History:  Diagnosis Date  . Elevated cholesterol     Past Surgical History:  Procedure Laterality Date  . CATARACT EXTRACTION, BILATERAL    . CESAREAN SECTION  1983   [place]  . DILATION AND CURETTAGE OF UTERUS      Current Outpatient Medications  Medication Sig Dispense Refill  . acetaminophen (TYLENOL) 500 MG tablet Take 2 tablets (1,000 mg total) by mouth every 6 (six) hours as needed for mild pain. 30 tablet 0  . albuterol (PROVENTIL HFA;VENTOLIN HFA) 108 (90 Base) MCG/ACT inhaler Inhale 1 puff into the lungs every 6 (six) hours as needed for wheezing or shortness of breath.     Marland Kitchen aspirin 81 MG EC tablet Take by mouth.    . B Complex-C (B-COMPLEX WITH VITAMIN C) tablet Take 1 tablet by mouth daily.    . calcium-vitamin D (OSCAL WITH D) 500-200  MG-UNIT TABS tablet Take 1 tablet by mouth 2 (two) times daily.     . Cholecalciferol (VITAMIN D) 50 MCG (2000 UT) CAPS Take 2,000 Units by mouth daily.     . Coenzyme Q10 (COQ-10) 100 MG CAPS Take 100 mg by mouth daily.     . cyclobenzaprine (FLEXERIL) 5 MG tablet Take 5 mg by mouth 3 (three) times daily as needed for muscle spasms.    . fexofenadine (ALLEGRA) 180 MG tablet Take 180 mg by mouth daily.     Marland Kitchen ibuprofen (ADVIL) 400 MG tablet Take 1-2 tablets (400-800 mg total) by mouth every 8 (eight) hours as needed for headache, mild pain or moderate pain. 30 tablet 0  . Lysine 500 MG TABS Take 500 mg by mouth daily.     . magnesium gluconate (MAGONATE) 500 MG tablet Take 500 mg by mouth daily. Takes 800 mg daily    . omeprazole (PRILOSEC) 40 MG capsule Take 40 mg by mouth every morning.    . pravastatin (PRAVACHOL) 20 MG tablet Take 20 mg by mouth at bedtime.     . Probiotic Product (ADVANCED PROBIOTIC-14) CAPS Take 1 capsule by mouth daily.    Marland Kitchen Red  Yeast Rice 600 MG TABS Take 600 mg by mouth daily.     Anson Fret 10 MCG TABS vaginal tablet Place 1 tablet (10 mcg total) vaginally 2 (two) times a week. (Patient taking differently: Place 10 mcg vaginally 2 (two) times a week. Tuesday and Friday) 24 tablet 3   No current facility-administered medications for this visit.     ALLERGIES: Tetracyclines & related  Family History  Problem Relation Age of Onset  . Hyperlipidemia Mother   . Hyperlipidemia Father   . Breast cancer Maternal Aunt   . Diabetes Maternal Grandfather   . Hypertension Sister     Social History   Socioeconomic History  . Marital status: Widowed    Spouse name: Not on file  . Number of children: Not on file  . Years of education: Not on file  . Highest education level: Not on file  Occupational History  . Not on file  Tobacco Use  . Smoking status: Never Smoker  . Smokeless tobacco: Never Used  Vaping Use  . Vaping Use: Never used  Substance and Sexual  Activity  . Alcohol use: Not Currently  . Drug use: Never  . Sexual activity: Not Currently    Birth control/protection: Post-menopausal  Other Topics Concern  . Not on file  Social History Narrative  . Not on file   Social Determinants of Health   Financial Resource Strain:   . Difficulty of Paying Living Expenses: Not on file  Food Insecurity:   . Worried About Programme researcher, broadcasting/film/video in the Last Year: Not on file  . Ran Out of Food in the Last Year: Not on file  Transportation Needs:   . Lack of Transportation (Medical): Not on file  . Lack of Transportation (Non-Medical): Not on file  Physical Activity:   . Days of Exercise per Week: Not on file  . Minutes of Exercise per Session: Not on file  Stress:   . Feeling of Stress : Not on file  Social Connections:   . Frequency of Communication with Friends and Family: Not on file  . Frequency of Social Gatherings with Friends and Family: Not on file  . Attends Religious Services: Not on file  . Active Member of Clubs or Organizations: Not on file  . Attends Banker Meetings: Not on file  . Marital Status: Not on file  Intimate Partner Violence:   . Fear of Current or Ex-Partner: Not on file  . Emotionally Abused: Not on file  . Physically Abused: Not on file  . Sexually Abused: Not on file    Review of Systems  All other systems reviewed and are negative.   PHYSICAL EXAMINATION:    BP 122/76 (Cuff Size: Large)   Pulse 70   Ht 5' 3.25" (1.607 m)   Wt 172 lb (78 kg)   LMP 03/05/2008 (Approximate)   BMI 30.23 kg/m     General appearance: alert, cooperative and appears stated age   Pelvic US  Uterus no masses.  EMS 1.59 mm with fluid in endometrial canal.  Ovaries normal.  No adnexal masses.  No free fluid.  ASSESSMENT  Postcoital bleeding.  Fluid in endometrial canal.  Thin endometrium 1.59 mm. Vaginal atrophy.   PLAN  Pelvic US report and images discussed with patient.  No endometrial biopsy  today.  For future bleeding, will do endometrial biopsy.  Ok to continue Vagifem.  Declines switching to estrogen cream or Estring.  Fu prn.  She will  call for any future vaginal bleeding.

## 2020-03-21 ENCOUNTER — Encounter: Payer: Self-pay | Admitting: Obstetrics and Gynecology

## 2020-03-21 DIAGNOSIS — Z01419 Encounter for gynecological examination (general) (routine) without abnormal findings: Secondary | ICD-10-CM

## 2020-03-22 MED ORDER — YUVAFEM 10 MCG VA TABS: 1.0000 | ORAL_TABLET | VAGINAL | 1 refills | Status: DC

## 2020-03-22 NOTE — Telephone Encounter (Signed)
Last Annual exam 04/08/19 Currently scheduled for annual exam 05/04/20.

## 2020-04-18 ENCOUNTER — Other Ambulatory Visit: Payer: Self-pay | Admitting: Family Medicine

## 2020-04-18 DIAGNOSIS — Z1231 Encounter for screening mammogram for malignant neoplasm of breast: Secondary | ICD-10-CM

## 2020-05-04 ENCOUNTER — Ambulatory Visit: Payer: Medicare Other | Admitting: Nurse Practitioner

## 2020-05-04 ENCOUNTER — Encounter: Payer: Self-pay | Admitting: Nurse Practitioner

## 2020-05-04 ENCOUNTER — Ambulatory Visit: Payer: No Typology Code available for payment source | Admitting: Obstetrics and Gynecology

## 2020-05-04 ENCOUNTER — Other Ambulatory Visit: Payer: Self-pay

## 2020-05-04 VITALS — BP 132/84 | HR 90 | Resp 12 | Ht 63.0 in | Wt 182.0 lb

## 2020-05-04 DIAGNOSIS — Z780 Asymptomatic menopausal state: Secondary | ICD-10-CM

## 2020-05-04 DIAGNOSIS — Z01419 Encounter for gynecological examination (general) (routine) without abnormal findings: Secondary | ICD-10-CM

## 2020-05-04 DIAGNOSIS — N951 Menopausal and female climacteric states: Secondary | ICD-10-CM

## 2020-05-04 MED ORDER — YUVAFEM 10 MCG VA TABS: 1.0000 | ORAL_TABLET | VAGINAL | 3 refills | Status: DC

## 2020-05-04 NOTE — Progress Notes (Signed)
   Angel Poole 1955-03-28 [id number]   History:  65 y.o. G2P1011 presents for breast and pelvic exam without GYN complaints. Postmenopausal - Yuvafem for dryness with good management. Normal pap and mammogram history. Has post-coital bleeding x 1 in June 2021 with normal ultrasound and likely from vaginal dryness. Not currently sexually active.   Gynecologic History Patient's last menstrual period was 03/05/2008 (approximate).   Contraception/Family planning: post menopausal status  Health Maintenance Last Pap: 08/2019. Results were: normal Last mammogram: 05/04/2019 showed possible asymmetry with normal F/U ultrasound. Scheduled 05/20/2020 Last colonoscopy: 2018, 5-year recall Last Dexa: 8-10 years ago per patient. Results were: normal  Past medical history, past surgical history, family history and social history were all reviewed and documented in the EPIC chart.  ROS:  A ROS was performed and pertinent positives and negatives are included.  Exam:  Vitals:   05/04/20 1129  BP: 132/84  Pulse: 90  Resp: 12  Weight: 182 lb (82.6 kg)  Height: 5\' 3"  (1.6 m)   Body mass index is 32.24 kg/m.  General appearance:  Normal Thyroid:  Symmetrical, normal in size, without palpable masses or nodularity. Respiratory  Auscultation:  Clear without wheezing or rhonchi Cardiovascular  Auscultation:  Regular rate, without rubs, murmurs or gallops  Edema/varicosities:  Not grossly evident Abdominal  Soft,nontender, without masses, guarding or rebound.  Liver/spleen:  No organomegaly noted  Hernia:  None appreciated  Skin  Inspection:  Grossly normal   Breasts: Examined lying and sitting.   Right: Without masses, retractions, discharge or axillary adenopathy.   Left: Without masses, retractions, discharge or axillary adenopathy. Gentitourinary   Inguinal/mons:  Normal without inguinal adenopathy  External genitalia:  Normal  BUS/Urethra/Skene's glands:  Normal  Vagina:   Normal  Cervix:  Normal  Uterus:  Normal in size, shape and contour.  Midline and mobile  Adnexa/parametria:     Rt: Without masses or tenderness.   Lt: Without masses or tenderness.  Anus and perineum: Normal  Digital rectal exam: Normal sphincter tone without palpated masses or tenderness  Assessment/Plan:  65 y.o. G1P1011 for annual exam.   Well female exam with routine gynecological exam - Education provided on SBEs, importance of preventative screenings, current guidelines, high calcium diet, regular exercise, and multivitamin daily. Labs with PCP.   Menopausal vaginal dryness - Plan: YUVAFEM 10 MCG TABS vaginal tablet twice weekly with good management.  She is aware of risk for systemic absorption.  She would like to continue.  Refill x1 year provided.  Postmenopausal - Plan: DG Bone Density.  Normal DEXA 8 to 10 years ago per patient in 75.  Screening for cervical cancer - Normal Pap history.  Discussed current guidelines and recommendation to stop screening.  She is agreeable.  Screening for breast cancer - Normal mammogram history.  Continue annual screenings.  Normal breast exam today.  Mammogram scheduled for this month.  Screening for colon cancer -2018 colonoscopy.  Will repeat at GI's recommended interval.   Follow-up in 1 year for annual.    2019 [facility], 12:02 PM 05/04/2020

## 2020-05-04 NOTE — Patient Instructions (Signed)

## 2020-05-20 ENCOUNTER — Inpatient Hospital Stay: Admission: RE | Admit: 2020-05-20 | Payer: PRIVATE HEALTH INSURANCE | Source: Ambulatory Visit

## 2020-05-20 DIAGNOSIS — Z1231 Encounter for screening mammogram for malignant neoplasm of breast: Secondary | ICD-10-CM

## 2020-06-21 ENCOUNTER — Other Ambulatory Visit: Payer: Self-pay

## 2020-06-21 ENCOUNTER — Ambulatory Visit (INDEPENDENT_AMBULATORY_CARE_PROVIDER_SITE_OTHER): Payer: Medicare Other

## 2020-06-21 ENCOUNTER — Other Ambulatory Visit: Payer: Self-pay | Admitting: Nurse Practitioner

## 2020-06-21 DIAGNOSIS — M8589 Other specified disorders of bone density and structure, multiple sites: Secondary | ICD-10-CM | POA: Diagnosis not present

## 2020-06-21 DIAGNOSIS — Z780 Asymptomatic menopausal state: Secondary | ICD-10-CM

## 2020-07-12 ENCOUNTER — Ambulatory Visit
Admission: RE | Admit: 2020-07-12 | Discharge: 2020-07-12 | Disposition: A | Discharge status: Home / Self Care | Payer: PRIVATE HEALTH INSURANCE | Source: Ambulatory Visit | Attending: Family Medicine | Admitting: Family Medicine

## 2020-07-12 ENCOUNTER — Other Ambulatory Visit: Payer: Self-pay

## 2020-07-12 ENCOUNTER — Telehealth: Payer: Self-pay | Admitting: *Deleted

## 2020-07-12 DIAGNOSIS — Z1231 Encounter for screening mammogram for malignant neoplasm of breast: Secondary | ICD-10-CM

## 2020-07-12 NOTE — Telephone Encounter (Signed)
I called patient and received voice mail and left message that results mailed out today to her.  However, if she would like she can call me back to get results. Phone number provided.

## 2020-07-12 NOTE — Telephone Encounter (Signed)
Patient left message on voicemail with questions regarding bone density results.   Routing to triage to follow-up.

## 2020-10-19 ENCOUNTER — Other Ambulatory Visit: Payer: Self-pay

## 2020-10-19 ENCOUNTER — Ambulatory Visit: Payer: Medicare Other | Admitting: Nurse Practitioner

## 2020-10-19 VITALS — BP 124/80

## 2020-10-19 DIAGNOSIS — B3731 Acute candidiasis of vulva and vagina: Secondary | ICD-10-CM

## 2020-10-19 DIAGNOSIS — B373 Candidiasis of vulva and vagina: Secondary | ICD-10-CM | POA: Diagnosis not present

## 2020-10-19 DIAGNOSIS — R300 Dysuria: Secondary | ICD-10-CM

## 2020-10-19 DIAGNOSIS — N898 Other specified noninflammatory disorders of vagina: Secondary | ICD-10-CM | POA: Diagnosis not present

## 2020-10-19 LAB — WET PREP FOR TRICH, YEAST, CLUE

## 2020-10-19 MED ORDER — FLUCONAZOLE 150 MG PO TABS: 150.0000 mg | ORAL_TABLET | ORAL | 0 refills | Status: DC

## 2020-10-19 NOTE — Progress Notes (Signed)
   Acute Office Visit  Subjective:    Patient ID: Angel Poole, female    DOB: 19-Jul-1955, 65 y.o.   MRN: [id number]   HPI 65 y.o. presents today for burning with urination, frequency, and vulvar irritation that started this morning. Upon waking when she went to use the bathroom she had a small amount of blood with vulvar burning. She is unsure if blood was from urine or vagina. She uses Yuvafem twice weekly for vaginal atrophy. She used it last night and is wondering if the tip of the applicator scraped her. She increased her water intake and feels her symptoms have improved some.    Review of Systems  Constitutional: Negative.   Genitourinary:  Positive for dysuria, frequency and vaginal pain (burning/irritation). Negative for urgency and vaginal discharge.      Objective:    Physical Exam Constitutional:      Appearance: Normal appearance.  Genitourinary:    Vagina: Normal.     Cervix: Normal.     Comments: Redness, atrophic changes present   BP 124/80   LMP 03/05/2008 (Approximate)  Wt Readings from Last 3 Encounters:  05/04/20 182 lb (82.6 kg)  10/22/19 172 lb (78 kg)  09/02/19 168 lb 9.6 oz (76.5 kg)   UA 1+ leukocytes, blood 2+, negative nitrate, SG 1.015, Cloudy yellow. Microscopic: wbc 10-20, rbc 3-10, few bacteria, few yeast  Wet prep + yeast with budding noted     Assessment & Plan:   Problem List Items Addressed This Visit   None Visit Diagnoses     Vulvovaginal candidiasis    -  Primary   Relevant Medications   fluconazole (DIFLUCAN) 150 MG tablet   Vaginal irritation       Relevant Orders   WET PREP FOR TRICH, YEAST, CLUE   Dysuria       Relevant Orders   Urinalysis,Complete w/RFL Culture      Plan: Wet prep positive for yeast - Diflucan 150 mg today and repeat in 3 days for total of 2 doses. Unable to determine if leukocytosis in urinalysis is from vaginal infection. Will wait on culture to treat. She is agreeable to plan.     Olivia Mackie DNP, 2:52 PM 10/19/2020

## 2020-10-22 LAB — URINALYSIS, COMPLETE W/RFL CULTURE
Bilirubin Urine: NEGATIVE
Glucose, UA: NEGATIVE
Hyaline Cast: NONE SEEN /LPF
Ketones, ur: NEGATIVE
Nitrites, Initial: NEGATIVE
Protein, ur: NEGATIVE
Specific Gravity, Urine: 1.015 (ref 1.001–1.035)
pH: 6.5 (ref 5.0–8.0)

## 2020-10-22 LAB — URINE CULTURE
MICRO NUMBER:: 12255141
SPECIMEN QUALITY:: ADEQUATE

## 2020-10-22 LAB — CULTURE INDICATED

## 2021-04-22 ENCOUNTER — Other Ambulatory Visit: Payer: Self-pay | Admitting: Nurse Practitioner

## 2021-04-22 DIAGNOSIS — N951 Menopausal and female climacteric states: Secondary | ICD-10-CM

## 2021-04-25 NOTE — Telephone Encounter (Signed)
AEX 05/04/20--scheduled for 05/08/21.  Mammo 07/12/20.

## 2021-04-26 ENCOUNTER — Other Ambulatory Visit: Payer: Self-pay

## 2021-04-26 DIAGNOSIS — N951 Menopausal and female climacteric states: Secondary | ICD-10-CM

## 2021-04-26 NOTE — Telephone Encounter (Signed)
Spoke with pt re: recent Rx refill on 04/22/21 for enough of the medication to last until AEX that was sent to the [facility] pharmacy. And then when patient comes in for annual exam, we can prescribe the years worth to the mail order pharmacy. Pt voiced understanding.

## 2021-04-28 ENCOUNTER — Other Ambulatory Visit: Payer: Self-pay

## 2021-04-28 DIAGNOSIS — N951 Menopausal and female climacteric states: Secondary | ICD-10-CM

## 2021-05-08 ENCOUNTER — Encounter: Payer: Self-pay | Admitting: Nurse Practitioner

## 2021-05-08 ENCOUNTER — Ambulatory Visit (INDEPENDENT_AMBULATORY_CARE_PROVIDER_SITE_OTHER): Payer: Medicare Other | Admitting: Nurse Practitioner

## 2021-05-08 ENCOUNTER — Other Ambulatory Visit: Payer: Self-pay

## 2021-05-08 VITALS — BP 122/80 | Ht 63.0 in | Wt 175.0 lb

## 2021-05-08 DIAGNOSIS — Z9189 Other specified personal risk factors, not elsewhere classified: Secondary | ICD-10-CM

## 2021-05-08 DIAGNOSIS — M8589 Other specified disorders of bone density and structure, multiple sites: Secondary | ICD-10-CM

## 2021-05-08 DIAGNOSIS — Z01419 Encounter for gynecological examination (general) (routine) without abnormal findings: Secondary | ICD-10-CM

## 2021-05-08 DIAGNOSIS — Z780 Asymptomatic menopausal state: Secondary | ICD-10-CM | POA: Diagnosis not present

## 2021-05-08 DIAGNOSIS — N951 Menopausal and female climacteric states: Secondary | ICD-10-CM | POA: Diagnosis not present

## 2021-05-08 MED ORDER — YUVAFEM 10 MCG VA TABS: ORAL_TABLET | VAGINAL | 3 refills | Status: DC

## 2021-05-08 NOTE — Progress Notes (Signed)
? ?  Angel Poole [birth year] [id number] ? ? ?History:  66 y.o. G2P1011 presents for breast and pelvic exam without GYN complaints. Postmenopausal - Yuvafem for dryness with good management. Normal pap and mammogram history. HLD managed by PCP.  ? ?Gynecologic History ?Patient's last menstrual period was 03/05/2008 (approximate). ?  ?Contraception/Family planning: post menopausal status ?Sexually active: No ? ?Health Maintenance ?Last Pap: 09/02/2019. Results were: Normal ?Last mammogram: 07/12/2020. Results were: Normal ?Last colonoscopy: 04/2021. Results were: Normal, 5-year recall ?Last Dexa: 06/21/2020. Results were: T-score -1.3, FRAX 13% / 1.2% ? ?Past medical history, past surgical history, family history and social history were all reviewed and documented in the EPIC chart. Widowed. Retired. 44 yo son, Tasia Catchings, just moved back to [facility].  ? ?ROS:  A ROS was performed and pertinent positives and negatives are included. ? ?Exam: ? ?Vitals:  ? 05/08/21 0956  ?BP: 122/80  ?Weight: 175 lb (79.4 kg)  ?Height: 5\' 3"  (1.6 m)  ? ? ?Body mass index is 31 kg/m?. ? ?General appearance:  Normal ?Thyroid:  Symmetrical, normal in size, without palpable masses or nodularity. ?Respiratory ? Auscultation:  Clear without wheezing or rhonchi ?Cardiovascular ? Auscultation:  Regular rate, without rubs, murmurs or gallops ? Edema/varicosities:  Not grossly evident ?Abdominal ? Soft,nontender, without masses, guarding or rebound. ? Liver/spleen:  No organomegaly noted ? Hernia:  None appreciated ? Skin ? Inspection:  Grossly normal ?  ?Breasts: Examined lying and sitting.  ? Right: Without masses, retractions, discharge or axillary adenopathy. ? ? Left: Without masses, retractions, discharge or axillary adenopathy. ?Genitourinary  ? Inguinal/mons:  Normal without inguinal adenopathy ? External genitalia:  Normal appearing vulva with no masses, tenderness, or lesions ? BUS/Urethra/Skene's glands:  Normal ? Vagina:  Normal  appearing with normal color and discharge, no lesions. Atrophic changes ? Cervix:  Normal appearing without discharge or lesions ? Uterus:  Normal in size, shape and contour.  Midline and mobile, nontender ? Adnexa/parametria:   ?  Rt: Normal in size, without masses or tenderness. ?  Lt: Normal in size, without masses or tenderness. ? Anus and perineum: Normal ? Digital rectal exam: Normal sphincter tone without palpated masses or tenderness ? ?Patient informed chaperone available to be present for breast and pelvic exam. Patient has requested no chaperone to be present. Patient has been advised what will be completed during breast and pelvic exam.  ? ?Assessment/Plan:  66 y.o. G1P1011 for annual exam.  ? ?Well female exam with routine gynecological exam - Education provided on SBEs, importance of preventative screenings, current guidelines, high calcium diet, regular exercise, and multivitamin daily. Labs with PCP.  ? ?Postmenopausal - no HRT, no bleeding ? ?Menopausal vaginal dryness - Plan: YUVAFEM 10 MCG TABS vaginal tablet twice weekly with good management. She would like to continue and is aware of risk for small amount of systemic absorption. Refill provided.  ? ?Osteopenia of multiple sites - T-score -1.3 without elevated FRAX. Will repeat April 2024. Continue Vitamin D + Calcium and regular exercise.  ? ?Screening for cervical cancer - Normal Pap history.  No longer screening per guidelines.  ? ?Screening for breast cancer - Normal mammogram history.  Continue annual screenings.  Normal breast exam today.  ? ?Screening for colon cancer - 2023 colonoscopy. Will repeat at GI's recommended interval.  ? ?Follow-up in 1 year for medication follow up.  ? ? ? ?2024 [facility], 10:29 AM 05/08/2021 ? ?

## 2021-05-29 ENCOUNTER — Other Ambulatory Visit: Payer: Self-pay | Admitting: Family Medicine

## 2021-05-29 DIAGNOSIS — Z1231 Encounter for screening mammogram for malignant neoplasm of breast: Secondary | ICD-10-CM

## 2021-05-31 ENCOUNTER — Encounter: Payer: Self-pay | Admitting: Nurse Practitioner

## 2021-05-31 ENCOUNTER — Ambulatory Visit: Payer: Medicare Other | Admitting: Nurse Practitioner

## 2021-05-31 ENCOUNTER — Other Ambulatory Visit: Payer: Self-pay

## 2021-05-31 VITALS — BP 124/80

## 2021-05-31 DIAGNOSIS — R300 Dysuria: Secondary | ICD-10-CM | POA: Diagnosis not present

## 2021-05-31 DIAGNOSIS — N898 Other specified noninflammatory disorders of vagina: Secondary | ICD-10-CM | POA: Diagnosis not present

## 2021-05-31 DIAGNOSIS — B3731 Acute candidiasis of vulva and vagina: Secondary | ICD-10-CM | POA: Diagnosis not present

## 2021-05-31 LAB — WET PREP FOR TRICH, YEAST, CLUE

## 2021-05-31 MED ORDER — FLUCONAZOLE 150 MG PO TABS: 150.0000 mg | ORAL_TABLET | ORAL | 0 refills | Status: DC

## 2021-05-31 NOTE — Progress Notes (Signed)
? ?  Acute Office Visit ? ?Subjective:  ? ? Patient ID: Angel Poole, female    DOB: 1955-08-25, 66 y.o.   MRN: [id number] ? ? ?HPI ?66 y.o. presents today for burning with urination and urgency that started yesterday. Her symptoms seem a little better today. She is also having mild vaginal itching. Denies discharge or odor.  ? ? ?Review of Systems  ?Constitutional: Negative.   ?Genitourinary:  Positive for dysuria, frequency, urgency and vaginal pain (Irritation, itching). Negative for difficulty urinating, flank pain, hematuria and vaginal discharge.  ? ?   ?Objective:  ?  ?Physical Exam ?Constitutional:   ?   Appearance: Normal appearance.  ?Genitourinary: ?   Vagina: Vaginal discharge and erythema present.  ?   Cervix: Normal.  ?   Uterus: Normal.   ?   Comments: Atrophic changes ? ? ?BP 124/80   LMP 03/05/2008 (Approximate)  ?Wt Readings from Last 3 Encounters:  ?05/08/21 175 lb (79.4 kg)  ?05/04/20 182 lb (82.6 kg)  ?10/22/19 172 lb (78 kg)  ? ? ?   ? ?Patient informed chaperone available to be present for breast and pelvic exam. Patient has requested no chaperone to be present. Patient has been advised what will be completed during breast and pelvic exam.  ? ?Wet prep + yeast (budding present) ?UA negative leukocytes, negative nitrites, 1+ blood, Yellow/clear, SG 1.020, pH 5.5. Microscopic: wbc none, rbc 3-10, few bacteria, few yeast ? ?Assessment & Plan:  ? ?Problem List Items Addressed This Visit   ?None ?Visit Diagnoses   ? ? Vaginal candidiasis    -  Primary  ? Relevant Medications  ? fluconazole (DIFLUCAN) 150 MG tablet  ? Dysuria      ? Relevant Orders  ? Urinalysis,Complete w/RFL Culture  ? Vagina itching      ? Relevant Orders  ? WET PREP FOR TRICH, YEAST, CLUE  ? ?  ? ?Plan: Wet prep positive for yeast - Diflucan 150 mg today and repeat in 3 days if symptoms persist. UA unremarkable. She will return if symptoms worsen or do not improve.  ? ? ? ? ?Olivia Mackie DNP, 9:14 AM 05/31/2021 ? ?

## 2021-06-02 LAB — URINALYSIS, COMPLETE W/RFL CULTURE
Bilirubin Urine: NEGATIVE
Glucose, UA: NEGATIVE
Hyaline Cast: NONE SEEN /LPF
Leukocyte Esterase: NEGATIVE
Nitrites, Initial: NEGATIVE
Protein, ur: NEGATIVE
Specific Gravity, Urine: 1.02 (ref 1.001–1.035)
WBC, UA: NONE SEEN /HPF (ref 0–5)
pH: 5.5 (ref 5.0–8.0)

## 2021-06-02 LAB — URINE CULTURE
MICRO NUMBER:: 13195555
SPECIMEN QUALITY:: ADEQUATE

## 2021-06-02 LAB — CULTURE INDICATED

## 2021-07-13 ENCOUNTER — Ambulatory Visit: Payer: Medicare Other

## 2021-07-13 ENCOUNTER — Ambulatory Visit
Admission: RE | Admit: 2021-07-13 | Discharge: 2021-07-13 | Disposition: A | Discharge status: Home / Self Care | Payer: Medicare Other | Source: Ambulatory Visit | Attending: Family Medicine | Admitting: Family Medicine

## 2021-07-13 DIAGNOSIS — Z1231 Encounter for screening mammogram for malignant neoplasm of breast: Secondary | ICD-10-CM

## 2021-08-03 ENCOUNTER — Ambulatory Visit (INDEPENDENT_AMBULATORY_CARE_PROVIDER_SITE_OTHER): Payer: Medicare Other | Admitting: Nurse Practitioner

## 2021-08-03 VITALS — BP 122/82

## 2021-08-03 DIAGNOSIS — N762 Acute vulvitis: Secondary | ICD-10-CM

## 2021-08-03 DIAGNOSIS — N9489 Other specified conditions associated with female genital organs and menstrual cycle: Secondary | ICD-10-CM

## 2021-08-03 DIAGNOSIS — B3731 Acute candidiasis of vulva and vagina: Secondary | ICD-10-CM

## 2021-08-03 LAB — WET PREP FOR TRICH, YEAST, CLUE

## 2021-08-03 MED ORDER — FLUCONAZOLE 150 MG PO TABS: 150.0000 mg | ORAL_TABLET | ORAL | 0 refills | Status: DC | PRN

## 2021-08-03 MED ORDER — CLOBETASOL PROPIONATE 0.05 % EX OINT: 1.0000 "application " | TOPICAL_OINTMENT | Freq: Two times a day (BID) | CUTANEOUS | 0 refills | Status: AC

## 2021-08-03 NOTE — Progress Notes (Signed)
   Acute Office Visit  Subjective:    Patient ID: Patte Winkel, female    DOB: 1955-06-07, 66 y.o.   MRN: [id number]   HPI 66 y.o. presents today for vulvar irritation x 2 weeks. Denies discharge or odor. Has not tried anything OTC. Does report switching detergents recently but thinks symptoms started prior to change. Not sexually active. Using [facility] for vaginal dryness and atrophy.    Review of Systems  Constitutional: Negative.   Genitourinary:  Positive for vaginal pain (Vulvar irritation/burning). Negative for genital sores and vaginal discharge.      Objective:    Physical Exam Constitutional:      Appearance: Normal appearance.  Genitourinary:    Vagina: Normal.     Cervix: Normal.      BP 122/82   LMP 03/05/2008 (Approximate)  Wt Readings from Last 3 Encounters:  05/08/21 175 lb (79.4 kg)  05/04/20 182 lb (82.6 kg)  10/22/19 172 lb (78 kg)        Patient informed chaperone available to be present for breast and/or pelvic exam. Patient has requested no chaperone to be present. Patient has been advised what will be completed during breast and pelvic exam.   Wet prep + yeast  Assessment & Plan:   Problem List Items Addressed This Visit   None Visit Diagnoses     Vulvovaginal candidiasis    -  Primary   Relevant Medications   fluconazole (DIFLUCAN) 150 MG tablet   Vulvar burning       Relevant Orders   WET PREP FOR TRICH, YEAST, CLUE   Acute vulvitis       Relevant Medications   clobetasol ointment (TEMOVATE) 0.05 %      Plan: Wet prep positive for yeast - Diflucan 150 mg today and repeat in 3 days. Provided her with extra as needed. She is traveling a lot and worried about recurrences since she has had multiple infections. Clobetasol ointment twice a day x 7 days. If irritation returns it is recommended she use Clobetasol twice weekly as her symptoms are also consistent with possible lichen sclerosus. She is agreeable to plan.      Olivia Mackie DNP, 4:14 PM 08/03/2021

## 2022-01-31 ENCOUNTER — Other Ambulatory Visit: Payer: Self-pay | Admitting: Nurse Practitioner

## 2022-01-31 ENCOUNTER — Telehealth: Payer: Self-pay | Admitting: *Deleted

## 2022-01-31 DIAGNOSIS — N951 Menopausal and female climacteric states: Secondary | ICD-10-CM

## 2022-01-31 MED ORDER — ESTRADIOL 0.1 MG/GM VA CREA: 1.0000 | TOPICAL_CREAM | VAGINAL | 2 refills | Status: DC

## 2022-01-31 NOTE — Telephone Encounter (Signed)
Patient called currently on using Angel Poole 10 mcg vaginal tablets reports she can save money if switched to estradiol vaginal cream 0.01%. She received a letter from [place] [facility] about this. She asked if the cream would be an option? Please advise

## 2022-01-31 NOTE — Telephone Encounter (Signed)
Estradiol vaginal cream sent to pharmacy. Thanks.

## 2022-01-31 NOTE — Telephone Encounter (Signed)
Patient informed. Rx sent 

## 2022-02-19 ENCOUNTER — Telehealth: Payer: Self-pay | Admitting: *Deleted

## 2022-02-19 DIAGNOSIS — N951 Menopausal and female climacteric states: Secondary | ICD-10-CM

## 2022-02-19 MED ORDER — ESTRADIOL 0.1 MG/GM VA CREA: 1.0000 g | TOPICAL_CREAM | VAGINAL | 1 refills | Status: DC

## 2022-02-19 NOTE — Telephone Encounter (Signed)
Patient called stating the estradiol vaginal cream 0.1 cream was d/c'd by [facility]. Report [facility] had questions about Rx and reached out to the office and never heard back so they d/c'd Rx. After reviewing Rx I assume it was related to directions "1 applicator full twice weekly" which would be 4 grams. I told her a new Rx would be sent with different directions. Estradiol vaginal cream is typically prescribed 1 gram vaginal twice weekly.   Rx pending to be signed with new directions to send to [facility].  Please advise

## 2022-05-17 ENCOUNTER — Encounter: Payer: Self-pay | Admitting: Nurse Practitioner

## 2022-05-23 ENCOUNTER — Ambulatory Visit (INDEPENDENT_AMBULATORY_CARE_PROVIDER_SITE_OTHER): Payer: Medicare Other | Admitting: Nurse Practitioner

## 2022-05-23 ENCOUNTER — Encounter: Payer: Self-pay | Admitting: Nurse Practitioner

## 2022-05-23 VITALS — BP 124/78 | HR 63 | Ht 63.0 in | Wt 181.0 lb

## 2022-05-23 DIAGNOSIS — M8589 Other specified disorders of bone density and structure, multiple sites: Secondary | ICD-10-CM | POA: Diagnosis not present

## 2022-05-23 DIAGNOSIS — N951 Menopausal and female climacteric states: Secondary | ICD-10-CM

## 2022-05-23 DIAGNOSIS — Z780 Asymptomatic menopausal state: Secondary | ICD-10-CM

## 2022-05-23 DIAGNOSIS — Z9189 Other specified personal risk factors, not elsewhere classified: Secondary | ICD-10-CM

## 2022-05-23 DIAGNOSIS — Z01419 Encounter for gynecological examination (general) (routine) without abnormal findings: Secondary | ICD-10-CM

## 2022-05-23 MED ORDER — ESTRADIOL 0.1 MG/GM VA CREA: 1.0000 g | TOPICAL_CREAM | VAGINAL | 2 refills | Status: DC

## 2022-05-23 NOTE — Progress Notes (Signed)
Angel Poole 1955/12/11 [id number]   History:  67 y.o. G2P1011 presents for breast and pelvic exam without GYN complaints. Postmenopausal - vaginal estradiol for dryness with good management. Could not figure out how to use tube, so she uses her finger. Normal pap and mammogram history. HLD managed by PCP.   Gynecologic History Patient's last menstrual period was 03/05/2008 (approximate).   Contraception/Family planning: post menopausal status Sexually active: No  Health Maintenance Last Pap: 09/02/2019. Results were: Normal neg HPV Last mammogram: 07/13/2021. Results were: Normal Last colonoscopy: 04/2021. Results were: Normal, 5-year recall Last Dexa: 06/21/2020. Results were: T-score -1.3, FRAX 13% / 1.2%  Past medical history, past surgical history, family history and social history were all reviewed and documented in the EPIC chart. Widowed. Retired. 75 yo son, Craig,living in [place].   ROS:  A ROS was performed and pertinent positives and negatives are included.  Exam:  Vitals:   05/23/22 0909  BP: 124/78  Pulse: 63  SpO2: 98%  Weight: 181 lb (82.1 kg)  Height: 5\' 3"  (1.6 m)     Body mass index is 32.06 kg/m.  General appearance:  Normal Thyroid:  Symmetrical, normal in size, without palpable masses or nodularity. Respiratory  Auscultation:  Clear without wheezing or rhonchi Cardiovascular  Auscultation:  Regular rate, without rubs, murmurs or gallops  Edema/varicosities:  Not grossly evident Abdominal  Soft,nontender, without masses, guarding or rebound.  Liver/spleen:  No organomegaly noted  Hernia:  None appreciated  Skin  Inspection:  Grossly normal Breasts: Examined lying and sitting.   Right: Without masses, retractions, discharge or axillary adenopathy.   Left: Without masses, retractions, discharge or axillary adenopathy. Genitourinary   Inguinal/mons:  Normal without inguinal adenopathy  External genitalia:  Normal appearing vulva with  no masses, tenderness, or lesions  BUS/Urethra/Skene's glands:  Normal  Vagina:  Normal appearing with normal color and discharge, no lesions. Atrophic changes  Cervix:  Normal appearing without discharge or lesions  Uterus:  Normal in size, shape and contour.  Midline and mobile, nontender  Adnexa/parametria:     Rt: Normal in size, without masses or tenderness.   Lt: Normal in size, without masses or tenderness.  Anus and perineum: Normal  Digital rectal exam: Deferred  Patient informed chaperone available to be present for breast and pelvic exam. Patient has requested no chaperone to be present. Patient has been advised what will be completed during breast and pelvic exam.   Assessment/Plan:  67 y.o. G1P1011 for annual exam.   Encounter for breast and pelvic examination - Education provided on SBEs, importance of preventative screenings, current guidelines, high calcium diet, regular exercise, and multivitamin daily. Labs with PCP.   Menopausal vaginal dryness - Plan: estradiol (ESTRACE) 0.1 MG/GM vaginal cream 1 gram twice weekly with good management. She would like to continue and is aware of risk for small amount of systemic absorption. Refill provided. Discussed how to use tube and recommend asking pharmacy for instructions if needed.   Osteopenia of multiple sites - Plan: DG Bone Density. T-score -1.3 without elevated FRAX April 2022. Continue Vitamin D + Calcium and regular exercise.   Screening for cervical cancer - Normal Pap history.  No longer screening per guidelines.   Screening for breast cancer - Normal mammogram history.  Continue annual screenings.  Normal breast exam today.   Screening for colon cancer - 2023 colonoscopy. Will repeat at GI's recommended interval.   Follow-up in 1 year for breast and pelvic exam.  Tamela Gammon [facility], 9:41 AM 05/23/2022

## 2022-05-30 ENCOUNTER — Other Ambulatory Visit: Payer: Self-pay | Admitting: Family Medicine

## 2022-05-30 DIAGNOSIS — Z1231 Encounter for screening mammogram for malignant neoplasm of breast: Secondary | ICD-10-CM

## 2022-05-31 ENCOUNTER — Ambulatory Visit: Payer: Medicare Other | Admitting: Radiology

## 2022-05-31 VITALS — BP 122/88

## 2022-05-31 DIAGNOSIS — N762 Acute vulvitis: Secondary | ICD-10-CM | POA: Diagnosis not present

## 2022-05-31 NOTE — Progress Notes (Signed)
      Subjective: Angel Poole is a 67 y.o. female who complains of severe external vulvar burning, no discharge, no urinary complaints. Symptoms began after using estradiol vaginal cream for the first time after switching from yuvafem 03/24 and 05/29/22 and increased over the last few days. Has prescription for clobetasol but hasn't used, desired ov before using.     Review of Systems  Past Medical History:  Diagnosis Date   DDD (degenerative disc disease), thoracic    Elevated cholesterol       Objective:  Today's Vitals   05/31/22 1446  BP: 122/88   There is no height or weight on file to calculate BMI.   Physical Exam Constitutional:      Appearance: Normal appearance. She is normal weight.  Pulmonary:     Effort: Pulmonary effort is normal.  Genitourinary:    Labia:        Right: Rash present. No tenderness or lesion.        Left: Rash present. No tenderness or lesion.      Vagina: Erythema present. No vaginal discharge or tenderness.  Neurological:     Mental Status: She is alert.       Chaperone offered and declined.  Assessment:/Plan:  1. Acute vulvitis Clobetasol BID  Baking soda soaks prn for comfort May apply aquaphor to skin to avoid irritation from estrace, if it continues will switch back to yuvafem   Avoid the use of soaps or perfumed products in the peri area. Avoid tub baths and sitting in sweaty or wet clothing for prolonged periods of time.

## 2022-06-25 ENCOUNTER — Encounter: Payer: Self-pay | Admitting: Nurse Practitioner

## 2022-06-25 NOTE — Telephone Encounter (Signed)
Health maintenance updated.   Routing to [employer].   Encounter closed.

## 2022-06-26 ENCOUNTER — Other Ambulatory Visit: Payer: Self-pay | Admitting: Nurse Practitioner

## 2022-06-26 ENCOUNTER — Ambulatory Visit (INDEPENDENT_AMBULATORY_CARE_PROVIDER_SITE_OTHER): Payer: Medicare Other

## 2022-06-26 DIAGNOSIS — N951 Menopausal and female climacteric states: Secondary | ICD-10-CM

## 2022-06-26 DIAGNOSIS — M8589 Other specified disorders of bone density and structure, multiple sites: Secondary | ICD-10-CM

## 2022-06-26 DIAGNOSIS — Z780 Asymptomatic menopausal state: Secondary | ICD-10-CM

## 2022-06-26 DIAGNOSIS — Z13820 Encounter for screening for osteoporosis: Secondary | ICD-10-CM

## 2022-06-26 DIAGNOSIS — M85852 Other specified disorders of bone density and structure, left thigh: Secondary | ICD-10-CM

## 2022-06-26 DIAGNOSIS — Z01419 Encounter for gynecological examination (general) (routine) without abnormal findings: Secondary | ICD-10-CM

## 2022-07-17 ENCOUNTER — Ambulatory Visit
Admission: RE | Admit: 2022-07-17 | Discharge: 2022-07-17 | Disposition: A | Discharge status: Home / Self Care | Payer: Medicare Other | Source: Ambulatory Visit | Attending: Family Medicine | Admitting: Family Medicine

## 2022-07-17 DIAGNOSIS — Z1231 Encounter for screening mammogram for malignant neoplasm of breast: Secondary | ICD-10-CM

## 2022-08-17 ENCOUNTER — Ambulatory Visit (INDEPENDENT_AMBULATORY_CARE_PROVIDER_SITE_OTHER): Payer: Medicare Other | Admitting: Radiology

## 2022-08-17 VITALS — BP 120/84

## 2022-08-17 DIAGNOSIS — B009 Herpesviral infection, unspecified: Secondary | ICD-10-CM

## 2022-08-17 MED ORDER — VALACYCLOVIR HCL 1 G PO TABS
1000.0000 mg | ORAL_TABLET | Freq: Two times a day (BID) | ORAL | 0 refills | Status: AC
Start: 2022-08-17 — End: ?

## 2022-08-17 NOTE — Progress Notes (Signed)
      Subjective: Angel Poole is a 67 y.o. female who complains of Complains of skin sensitivity outer area of left vulva/perineum. Treating area with Aquaphor. Also tried clobetasol, without much relief. Noticed symptoms 3 days ago. Hx of HSV.   Review of Systems  All other systems reviewed and are negative.   Past Medical History:  Diagnosis Date   DDD (degenerative disc disease), thoracic    Elevated cholesterol       Objective:  Today's Vitals   08/17/22 0933  BP: 120/84   There is no height or weight on file to calculate BMI.   Physical Exam Constitutional:      Appearance: Normal appearance. She is normal weight.  Pulmonary:     Effort: Pulmonary effort is normal.  Genitourinary:    Labia:        Right: No rash, tenderness or lesion.        Left: No rash, tenderness or lesion.      Vagina: Erythema present. No vaginal discharge or tenderness.       Comments: Vesicular lesion ~52mm, tender to touch Neurological:     Mental Status: She is alert.      Raynelle Fanning, CMA present for exam  Assessment:/Plan:  1. HSV infection Avoid clobetasol on the lesion - valACYclovir (VALTREX) 1000 MG tablet; Take 1 tablet (1,000 mg total) by mouth 2 (two) times daily.  Dispense: 6 tablet; Refill: 0   Comfort measures reviewed, questions answered

## 2023-05-29 ENCOUNTER — Encounter: Payer: Self-pay | Admitting: Nurse Practitioner

## 2023-05-29 ENCOUNTER — Ambulatory Visit (INDEPENDENT_AMBULATORY_CARE_PROVIDER_SITE_OTHER): Payer: Medicare Other | Admitting: Nurse Practitioner

## 2023-05-29 VITALS — BP 122/82 | HR 67 | Ht 62.95 in | Wt 183.0 lb

## 2023-05-29 DIAGNOSIS — M8580 Other specified disorders of bone density and structure, unspecified site: Secondary | ICD-10-CM | POA: Diagnosis not present

## 2023-05-29 DIAGNOSIS — N951 Menopausal and female climacteric states: Secondary | ICD-10-CM

## 2023-05-29 DIAGNOSIS — M85851 Other specified disorders of bone density and structure, right thigh: Secondary | ICD-10-CM

## 2023-05-29 DIAGNOSIS — B009 Herpesviral infection, unspecified: Secondary | ICD-10-CM | POA: Diagnosis not present

## 2023-05-29 DIAGNOSIS — Z9189 Other specified personal risk factors, not elsewhere classified: Secondary | ICD-10-CM

## 2023-05-29 DIAGNOSIS — Z780 Asymptomatic menopausal state: Secondary | ICD-10-CM

## 2023-05-29 DIAGNOSIS — Z01419 Encounter for gynecological examination (general) (routine) without abnormal findings: Secondary | ICD-10-CM

## 2023-05-29 MED ORDER — ESTRADIOL 0.1 MG/GM VA CREA: 1.0000 g | TOPICAL_CREAM | VAGINAL | 2 refills | Status: DC

## 2023-05-29 NOTE — Progress Notes (Signed)
 Angel Poole 1955-10-12 [id number]   History:  68 y.o. G2P1011 presents for breast and pelvic exam without GYN complaints. Postmenopausal - vaginal estradiol for dryness with good management. Normal pap and mammogram history. HLD managed by PCP.   Gynecologic History Patient's last menstrual period was 03/05/2008 (approximate).   Contraception/Family planning: post menopausal status Sexually active: No  Health Maintenance Last Pap: 09/02/2019. Results were: Normal neg HPV Last mammogram: 07/17/2022. Results were: Normal Last colonoscopy: 04/2021. Results were: Normal, 5-year recall Last Dexa: 06/26/2022. Results were: T-score -1.1, FRAX 8.2% / 0.7%  Past medical history, past surgical history, family history and social history were all reviewed and documented in the EPIC chart. Widowed. Retired. 81 yo son, Tasia Catchings, living in [place]. Sister and her daughter living with her.   ROS:  A ROS was performed and pertinent positives and negatives are included.  Exam:  Vitals:   05/29/23 1441  BP: 122/82  Pulse: 67  SpO2: 98%  Weight: 183 lb (83 kg)  Height: 5' 2.95" (1.599 m)     Body mass index is 32.47 kg/m.  General appearance:  Normal Thyroid:  Symmetrical, normal in size, without palpable masses or nodularity. Respiratory  Auscultation:  Clear without wheezing or rhonchi Cardiovascular  Auscultation:  Regular rate, without rubs, murmurs or gallops  Edema/varicosities:  Not grossly evident Abdominal  Soft,nontender, without masses, guarding or rebound.  Liver/spleen:  No organomegaly noted  Hernia:  None appreciated  Skin  Inspection:  Grossly normal Breasts: Examined lying and sitting.   Right: Without masses, retractions, discharge or axillary adenopathy.   Left: Without masses, retractions, discharge or axillary adenopathy. Pelvic: External genitalia:  no lesions              Urethra:  normal appearing urethra with no masses, tenderness or lesions               Bartholins and Skenes: normal                 Vagina: normal appearing vagina with normal color and discharge, no lesions. Atrophic changes              Cervix: no lesions Bimanual Exam:  Uterus:  no masses or tenderness              Adnexa: no mass, fullness, tenderness              Rectovaginal: Deferred              Anus:  normal, no lesions  Patient informed chaperone available to be present for breast and pelvic exam. Patient has requested no chaperone to be present. Patient has been advised what will be completed during breast and pelvic exam.   Assessment/Plan:  68 y.o. G1P1011 for breast and pelvic exam.   Encounter for breast and pelvic examination - Education provided on SBEs, importance of preventative screenings, current guidelines, high calcium diet, regular exercise, and multivitamin daily. Labs with PCP.   Menopausal vaginal dryness - Plan: estradiol (ESTRACE) 0.1 MG/GM vaginal cream 1 gram twice weekly with good management.   Osteopenia of necks of both femurs - 07/2022 T-score -1.1. Continue Vit D + Calcium, increase exercise.   Screening for cervical cancer - Normal Pap history.  No longer screening per guidelines.   Screening for breast cancer - Normal mammogram history.  Continue annual screenings.  Normal breast exam today.   Screening for colon cancer - 2023 colonoscopy. Will repeat at GI's recommended interval.  Return in about 1 year (around 05/28/2024) for B&P (high risk).    Olivia Mackie [facility], 3:21 PM 05/29/2023

## 2023-06-06 ENCOUNTER — Other Ambulatory Visit: Payer: Self-pay | Admitting: Family Medicine

## 2023-06-06 DIAGNOSIS — Z1231 Encounter for screening mammogram for malignant neoplasm of breast: Secondary | ICD-10-CM

## 2023-09-02 ENCOUNTER — Ambulatory Visit
Admission: RE | Admit: 2023-09-02 | Discharge: 2023-09-02 | Disposition: A | Discharge status: Home / Self Care | Source: Ambulatory Visit | Attending: Family Medicine | Admitting: Family Medicine

## 2023-09-02 ENCOUNTER — Ambulatory Visit: Admitting: Radiology

## 2023-09-02 VITALS — BP 117/74 | HR 74 | Temp 98.6°F | Resp 17

## 2023-09-02 DIAGNOSIS — M79644 Pain in right finger(s): Secondary | ICD-10-CM | POA: Diagnosis not present

## 2023-09-02 DIAGNOSIS — S6990XA Unspecified injury of unspecified wrist, hand and finger(s), initial encounter: Secondary | ICD-10-CM | POA: Diagnosis not present

## 2023-09-02 DIAGNOSIS — S62626A Displaced fracture of medial phalanx of right little finger, initial encounter for closed fracture: Secondary | ICD-10-CM

## 2023-09-02 MED ORDER — ACETAMINOPHEN 325 MG PO TABS
975.0000 mg | ORAL_TABLET | Freq: Once | ORAL | Status: AC
  Administered 2023-09-02: 975 mg via ORAL

## 2023-09-02 NOTE — Discharge Instructions (Addendum)
 Your pinky finger is broken Rest, ice, elevate, wear finger splint until seen by orthopedics-call today for appointment May alternate Tylenol  ibuprofen  as label directed pain

## 2023-09-02 NOTE — ED Triage Notes (Addendum)
 [place] [employer]. States she fell on right side and all weight went on right little finger.  She thought finger was okay and went into wave pool and her friend accidentally hit finger and it was bent 90 degrees and she bent it back C/o right pinky pain and swelling.

## 2023-09-02 NOTE — ED Provider Notes (Addendum)
 [facility]    CSN: [id number] Arrival date & time: 09/02/23  1311      History   Chief Complaint Chief Complaint  Patient presents with   Finger Injury    Entered by patient    HPI Angel Poole is a 68 y.o. female.   68 year old female, Angel Poole, presents to urgent care for evaluation of right pinky finger injury prior to arrival.  Patient [place] and [place] andinjured her right little finger, went backwards, thought  finger was okay, went [place] wave [place] with friend and friend accidentally hit  finger and it bent 90 degrees,  patient bent finger back into place.  Patient is complaining of right pinky pain, bruising and swelling.   The history is provided by the patient. No language interpreter was used.    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 Authorizing Provider  B Complex-C (B-COMPLEX WITH VITAMIN C) tablet Take 1 tablet by mouth daily.    [provider]  betamethasone dipropionate 0.05 % cream Apply topically. 01/09/20   [provider]  calcium-vitamin D (OSCAL  WITH D) 500-200 MG-UNIT TABS tablet Take 1 tablet by mouth 2 (two) times daily.     [provider]  Cholecalciferol (VITAMIN D) 50 MCG (2000 UT) CAPS Take 2,000 Units by mouth daily.     [provider]  clobetasol  ointment (TEMOVATE ) 0.05 % Apply topically. 08/03/21   [provider]  Coenzyme Q10 (COQ-10) 100 MG CAPS Take 100 mg by mouth daily.     [provider]  estradiol  (ESTRACE ) 0.1 MG/GM vaginal cream Place 1 g vaginally 2 (two) times a week. 05/30/23   [name], NP  fexofenadine (ALLEGRA) 180 MG tablet Take 180 mg by mouth daily.    [provider]  Lysine 500 MG TABS Take 500 mg by mouth daily.     [provider]  magnesium gluconate (MAGONATE) 500 MG tablet Take 800 mg by mouth daily. Takes 800 mg daily    [provider]  Omega-3 Fatty Acids (FISH OIL PO) Take by mouth.    [provider]  omeprazole (PRILOSEC) 40 MG capsule Take 20 mg by mouth every morning. 06/23/19   [provider]  pravastatin  (PRAVACHOL ) 20 MG tablet Take 20 mg by mouth at bedtime.  02/22/18   [provider]  Probiotic Product (ADVANCED PROBIOTIC-14) CAPS Take 1 capsule by mouth daily.    [provider]  Red Yeast Rice 600 MG TABS Take 600 mg by mouth daily.     [provider]  valACYclovir  (VALTREX ) 1000 MG tablet Take 1 tablet (1,000 mg total) by mouth 2 (two) times daily. 08/17/22   [name], [name], NP    Family History Family History  Problem Relation Age of Onset   Hyperlipidemia Mother    Hyperlipidemia Father    Diabetes Sister    Hypertension Sister    Breast cancer Maternal Aunt    Diabetes Maternal Grandfather     Social History Social History   Tobacco Use   Smoking status: Never   Smokeless tobacco: Never  Vaping Use   Vaping status: Never Used  Substance Use Topics   Alcohol use: Yes    Comment: Social   Drug use: Never     Allergies   Tetracyclines &  related   Review of Systems Review of Systems  Musculoskeletal:  Positive for arthralgias, joint swelling and myalgias.  Skin:  Positive for color change.  All other systems reviewed and are negative.    Physical Exam Triage Vital Signs ED Triage Vitals  Encounter Vitals Group     BP 09/02/23 1322 117/74     Girls Systolic BP Percentile --      Girls Diastolic BP Percentile --      Boys Systolic BP Percentile --      Boys Diastolic BP Percentile --      Pulse Rate 09/02/23 1322 74     Resp 09/02/23 1322 17     Temp 09/02/23 1322 98.6 F (37 C)     Temp Source 09/02/23 1322 Oral     SpO2 09/02/23 1322 94 %     Weight --      Height --      Head Circumference --      Peak Flow --      Pain Score 09/02/23 1321 8     Pain Loc --      Pain Education --      Exclude from Growth Chart --    No data found.  Updated Vital Signs BP 117/74 (BP Location: Left Arm)   Pulse 74   Temp 98.6 F (37 C) (Oral)   Resp 17   LMP 03/05/2008 (Approximate)   SpO2 94%   Visual Acuity Right Eye Distance:   Left Eye Distance:   Bilateral Distance:    Right Eye Near:   Left Eye Near:    Bilateral Near:     Physical Exam Vitals and nursing note reviewed.   Cardiovascular:     Rate and Rhythm: Normal rate.     Pulses:          Radial pulses are 2+ on the right side.   Musculoskeletal:     Right hand: Deformity and bony tenderness present. Decreased range of motion. Normal pulse.     Comments: Right pinky finger swollen, bruised, slightly bent   Neurological:     General: No focal deficit present.     Mental Status: She is alert and oriented to person, place, and time.     GCS: GCS eye subscore is 4. GCS verbal subscore is 5. GCS motor subscore is 6.     Sensory:  Sensation is intact.     Gait: Gait is intact.     Comments: Decreased range of motion of right pinky finger  Psychiatric:        Attention and Perception: Attention normal.        Mood and Affect: Mood normal.         Speech: Speech normal.      UC Treatments / Results  Labs (all labs ordered are listed, but only abnormal results are displayed) Labs Reviewed - No data to display  EKG   Radiology DG Finger Little Right Result Date: 09/02/2023 CLINICAL DATA:  Pain after injury EXAM: RIGHT LITTLE FINGER 3V COMPARISON:  None Available. FINDINGS: There is a fracture of the base of the middle phalanx of the fifth digit with displaced fragments. Fracture line extends to the proximal interphalangeal joint. There is slight subluxation at this joint space with the middle distal phalanx displaced slightly medial and dorsal. Underlying osteopenia. No additional fracture or dislocation. There is some sclerosis of the distal shaft of the fourth metacarpal which has a benign appearance. IMPRESSION: This a displaced fracture of the base of the middle phalanx of the fifth digit extending to the adjacent joint space. Some subluxation of the proximal interphalangeal joint. Electronically Signed   By: Ranell Bring M.D.   On: 09/02/2023 13:47    Procedures Procedures (including critical care time)  Medications Ordered in UC Medications  acetaminophen  (TYLENOL ) tablet 975 mg (975 mg Oral Given 09/02/23 1332)    Initial Impression / Assessment and Plan / UC Course  I have reviewed the triage vital signs and the nursing notes.  Pertinent labs & imaging results that were available during my care of the patient were reviewed by me and considered in my medical decision making (see chart for details).    Discussed exam findings and plan of care with patient, patient has close displaced fracture of middle phalanx of right pinky, slight subluxation, will apply aluminum finger splint and buddy tape pinky to fourth finger right hand, rest, ice, elevate, wear splint until seen by your orthopedist-call today for appointment.  If you have new or worsening symptoms go to the ER for further evaluation, patient verbalized  understanding to this provider  Ddx: Finger fracture, dislocation, finger pain, contusion Final Clinical Impressions(s) / UC Diagnoses   Final diagnoses:  Closed displaced fracture of middle phalanx of right little finger, initial encounter  Finger injury, initial encounter  Pain of finger of right hand     Discharge Instructions      Your pinky finger is broken Rest, ice, elevate, wear finger splint until seen by orthopedics-call today for appointment May alternate Tylenol  ibuprofen  as label directed pain    ED Prescriptions   None    PDMP not reviewed this encounter.   Aminta Loose, NP 09/02/23 1506    Aminta Loose, NP 09/02/23 1506

## 2023-09-03 ENCOUNTER — Ambulatory Visit

## 2023-09-23 ENCOUNTER — Ambulatory Visit

## 2023-10-14 ENCOUNTER — Ambulatory Visit
Admission: RE | Admit: 2023-10-14 | Discharge: 2023-10-14 | Disposition: A | Discharge status: Home / Self Care | Source: Ambulatory Visit | Attending: Family Medicine | Admitting: Family Medicine

## 2023-10-14 DIAGNOSIS — Z1231 Encounter for screening mammogram for malignant neoplasm of breast: Secondary | ICD-10-CM

## 2024-02-23 ENCOUNTER — Other Ambulatory Visit: Payer: Self-pay | Admitting: Nurse Practitioner

## 2024-02-23 DIAGNOSIS — N951 Menopausal and female climacteric states: Secondary | ICD-10-CM

## 2024-02-24 NOTE — Telephone Encounter (Signed)
 Med refill request:ESTRACE   Last AEX: 05/29/23  Next AEX: not scheduled  Last MMG (if hormonal med) 10/14/23 birads cat 1 neg  Refill authorized: last rx 05/30/23 #42.5g with 2 refills. Please Advise?

## 2024-02-28 IMAGING — MG MM DIGITAL SCREENING BILAT W/ TOMO AND CAD
6 of 10 series · 6 of 30 positions shown · non-contrast
Comparison: Previous exam(s).

CLINICAL DATA: Screening.

EXAM:
DIGITAL SCREENING BILATERAL MAMMOGRAM WITH TOMOSYNTHESIS AND CAD
TECHNIQUE: Bilateral screening digital craniocaudal and mediolateral oblique
mammograms were obtained. Bilateral screening digital breast
tomosynthesis was performed. The images were evaluated with
computer-aided detection.

[L MLO synth-2D (1 of 2)]
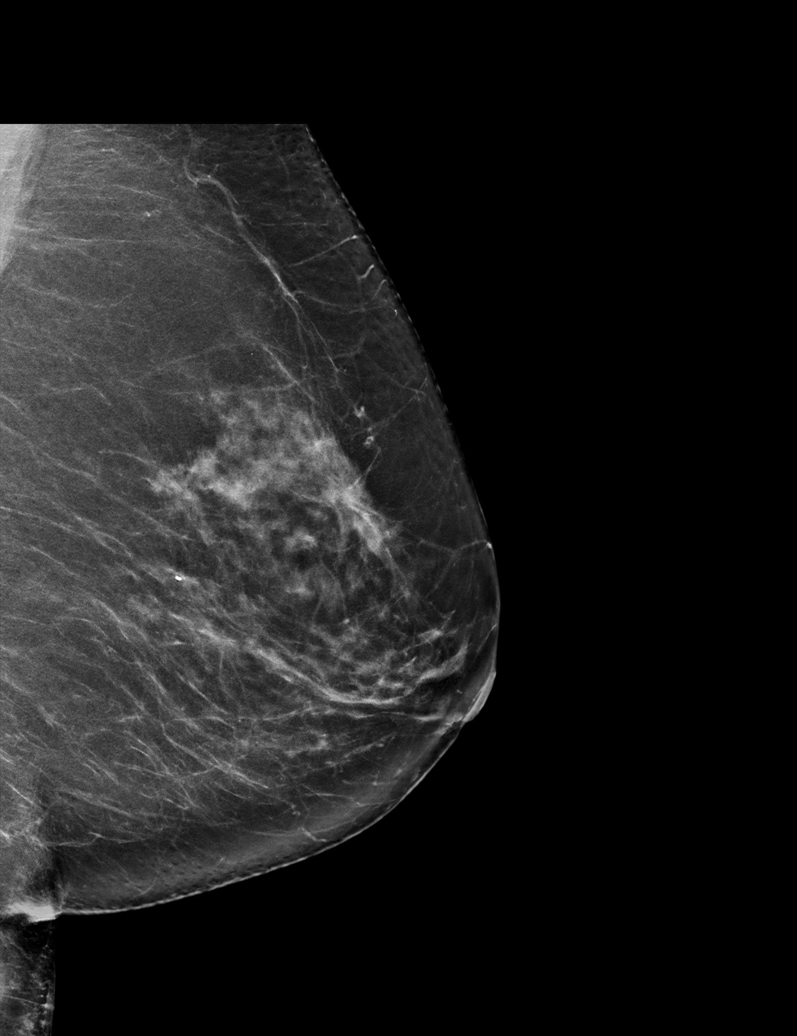

[L CC synth-2D]
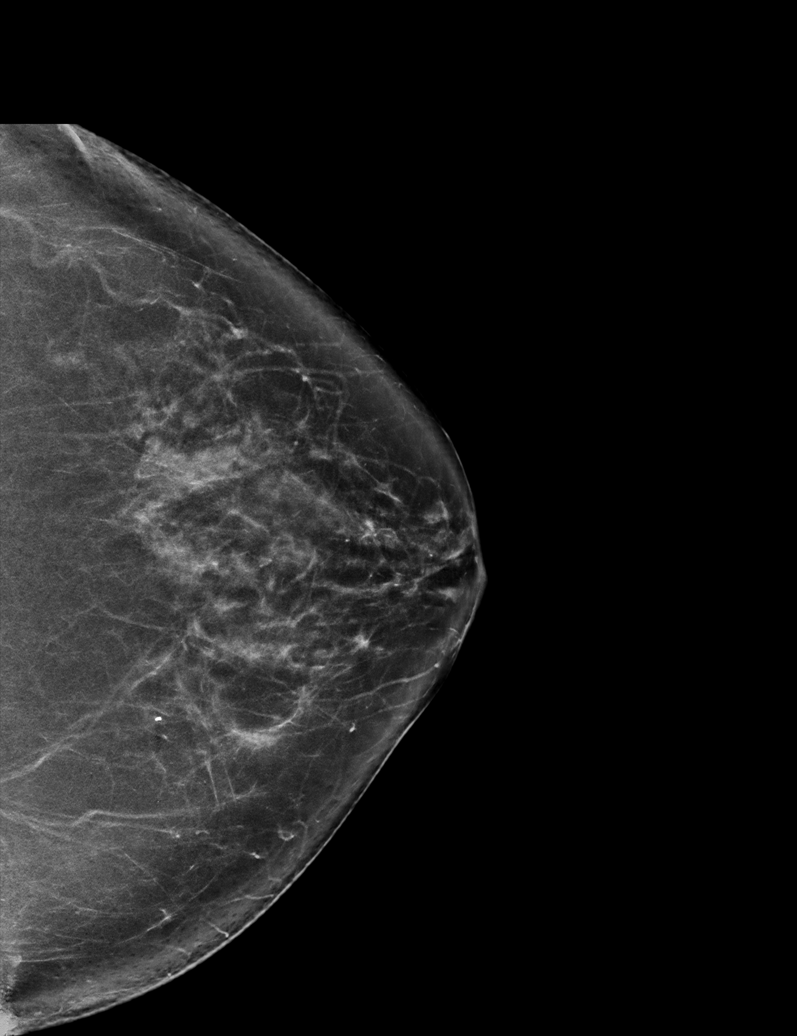

[R CC synth-2D]
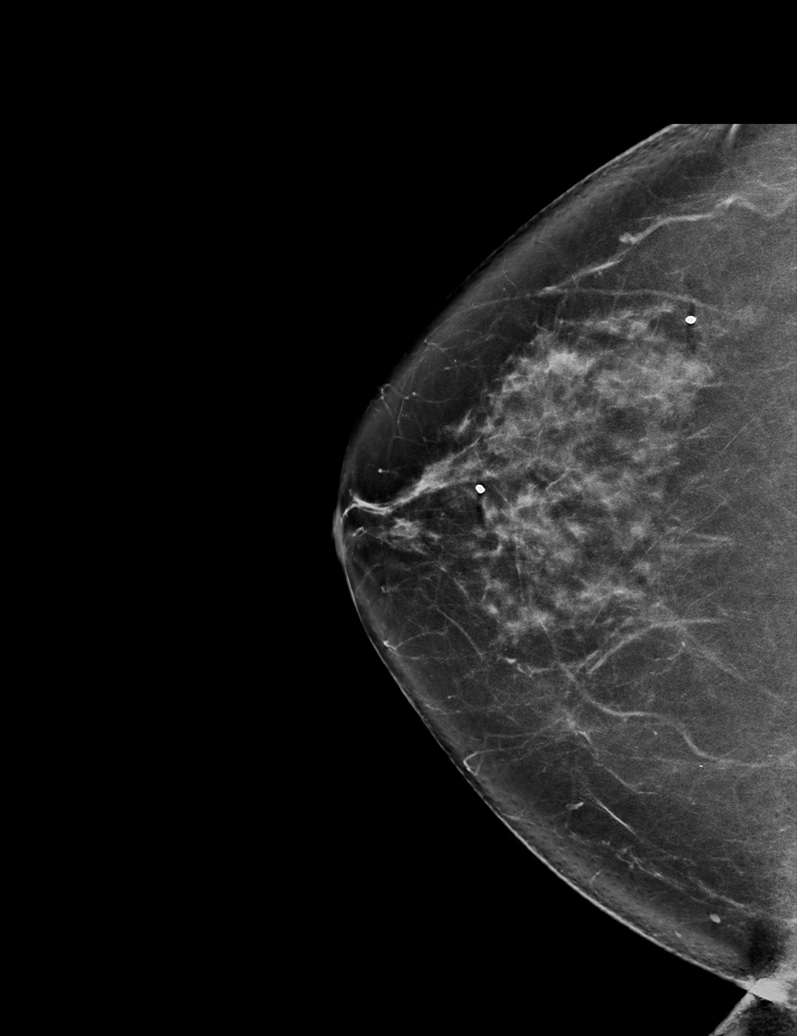

[L MLO synth-2D (2 of 2)]
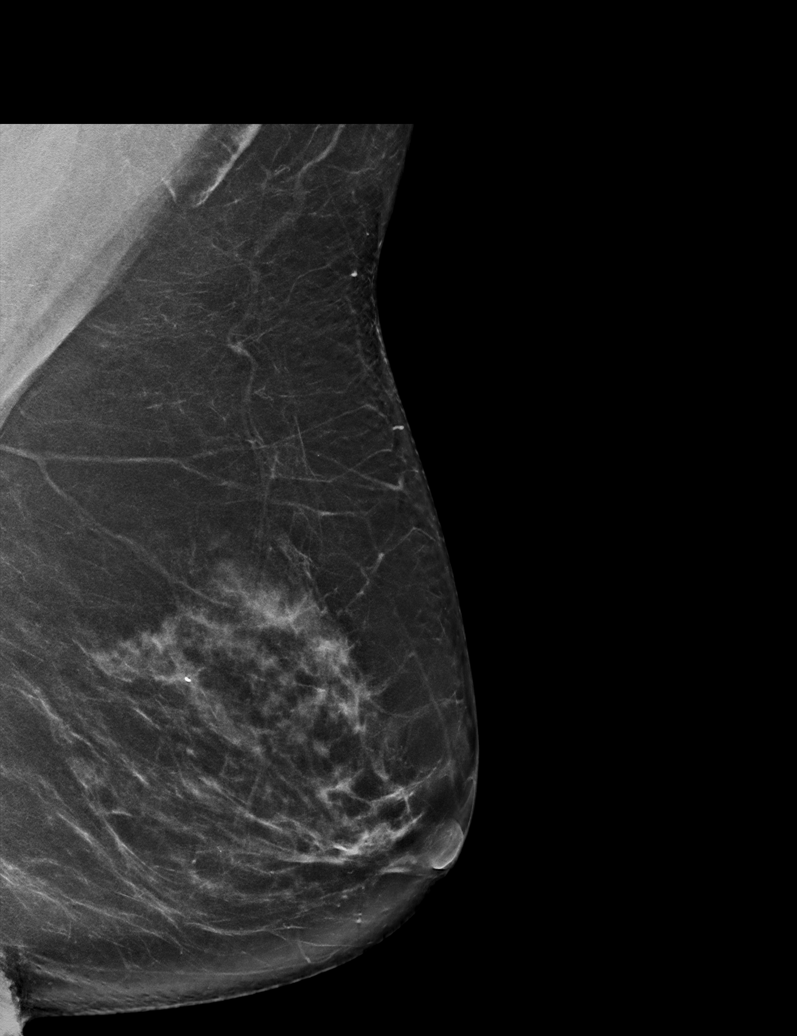

[R MLO synth-2D]
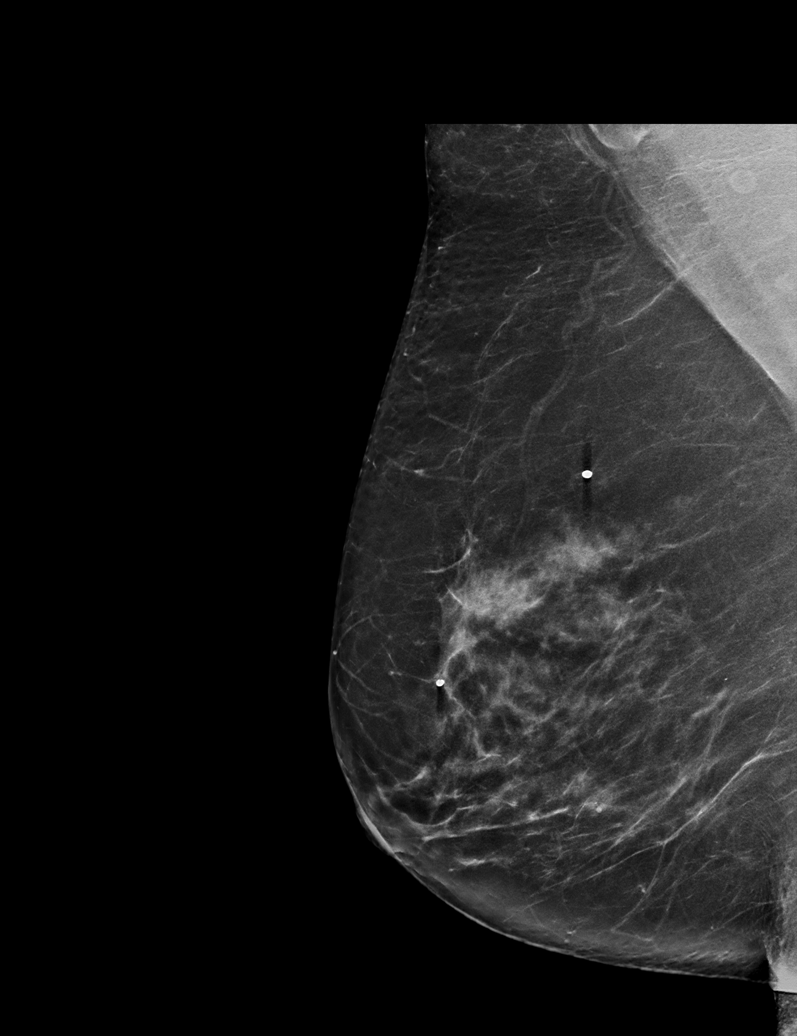

[R CC tomo · tomo slice 35/69.0]
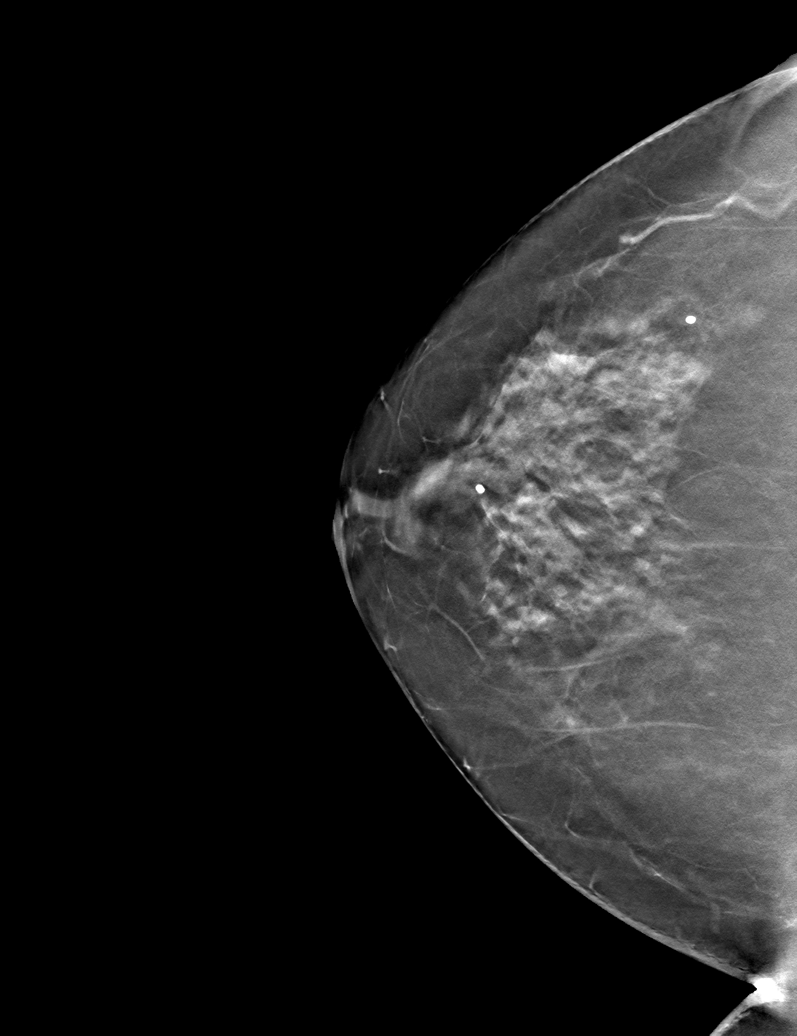

[6 of 30 positions shown; findings below may reference images not displayed]

ACR Breast Density Category c: The breast tissue is heterogeneously
dense, which may obscure small masses.
FINDINGS: There are no findings suspicious for malignancy.
IMPRESSION: No mammographic evidence of malignancy. A result letter of this
screening mammogram will be mailed directly to the patient.

RECOMMENDATION:
Screening mammogram in one year. (Code:Q3-W-BC3)

BI-RADS CATEGORY  1: Negative.

## 2024-06-25 ENCOUNTER — Encounter: Admitting: Nurse Practitioner
